# Patient Record
Sex: Male | Born: 1955 | Race: White | Hispanic: No | Smoking: Current every day smoker
Health system: Southern US, Community
[De-identification: ages and names within clinical notes are randomized; demographics above are authoritative.]

## PROBLEM LIST (undated history)

## (undated) DIAGNOSIS — M25519 Pain in unspecified shoulder: Secondary | ICD-10-CM

## (undated) DIAGNOSIS — J449 Chronic obstructive pulmonary disease, unspecified: Secondary | ICD-10-CM

## (undated) DIAGNOSIS — N400 Enlarged prostate without lower urinary tract symptoms: Secondary | ICD-10-CM

## (undated) DIAGNOSIS — M1990 Unspecified osteoarthritis, unspecified site: Secondary | ICD-10-CM

## (undated) HISTORY — DX: Pain in unspecified shoulder: M25.519

## (undated) HISTORY — PX: BACK SURGERY: SHX140

## (undated) HISTORY — DX: Unspecified osteoarthritis, unspecified site: M19.90

---

## 1998-03-07 ENCOUNTER — Emergency Department (HOSPITAL_COMMUNITY): Admission: EM | Admit: 1998-03-07 | Discharge: 1998-03-07 | Payer: Self-pay | Admitting: Emergency Medicine

## 2001-03-02 ENCOUNTER — Inpatient Hospital Stay (HOSPITAL_COMMUNITY): Admission: EM | Admit: 2001-03-02 | Discharge: 2001-03-05 | Payer: Self-pay | Admitting: Emergency Medicine

## 2001-03-02 ENCOUNTER — Encounter: Payer: Self-pay | Admitting: Emergency Medicine

## 2001-03-03 ENCOUNTER — Encounter: Payer: Self-pay | Admitting: Family Medicine

## 2012-07-19 ENCOUNTER — Telehealth: Payer: Self-pay | Admitting: General Practice

## 2012-07-19 ENCOUNTER — Encounter: Payer: Self-pay | Admitting: Family Medicine

## 2012-07-19 ENCOUNTER — Ambulatory Visit: Payer: Self-pay | Admitting: Family Medicine

## 2012-07-19 VITALS — BP 113/78 | HR 110 | Temp 97.6°F | Ht 69.0 in | Wt 154.6 lb

## 2012-07-19 DIAGNOSIS — M25559 Pain in unspecified hip: Secondary | ICD-10-CM

## 2012-07-19 DIAGNOSIS — M549 Dorsalgia, unspecified: Secondary | ICD-10-CM

## 2012-07-19 DIAGNOSIS — M25551 Pain in right hip: Secondary | ICD-10-CM

## 2012-07-19 NOTE — Progress Notes (Signed)
Patient ID: James Mills, male   DOB: 1955/06/28, 57 y.o.   MRN: [id number] SUBJECTIVE: HPI: Has had gradual right hip pain and then it got so bad that he had to go to the [address] clinic at [facility] area. They advised him to follow up with his PCP. He has not been here in years and opted to come and  Re-establish to get worked up and treated. He was empirically treated with pain and anti-inflammatory meds at the Urgent care. Pain to bear weight on the right hip. Pain in the lower back goes to the right hip and the side of the right thigh.  PMH/PSH: reviewed/updated in Epic  SH/FH: reviewed/updated in Epic  Allergies: reviewed/updated in Epic  Medications: reviewed/updated in Epic  Immunizations: reviewed/updated in Epic  ROS: As above in the HPI. All other systems are stable or negative.  OBJECTIVE: APPEARANCE:  Patient in no acute distress.The patient appeared well nourished and normally developed. Acyanotic. Waist: VITAL SIGNS:BP 113/78  Pulse 110  Temp(Src) 97.6 F (36.4 C) (Oral)  Ht 5\' 9"  (1.753 m)  Wt 154 lb 9.6 oz (70.126 kg)  BMI 22.82 kg/m2  WM limping to walk.  SKIN: warm and  Dry without overt rashes, tattoos and scars  HEAD and Neck: without JVD, Head and scalp: normal Eyes:No scleral icterus. Fundi normal, eye movements normal. Ears: Auricle normal, canal normal, Tympanic membranes normal, insufflation normal. Nose: normal Throat: normal Neck & thyroid: normal  CHEST & LUNGS: Chest wall: normal Lungs: Coarse BS. No Rales. There is scatterred Rhonchi  CVS: Reveals the PMI to be normally located. Regular rhythm, First and Second Heart sounds are normal,  absence of murmurs, rubs or gallops. Peripheral vasculature: Radial pulses: normal Dorsal pedis pulses: normal Posterior pulses: normal  ABDOMEN:  Appearance: normal Benign,, no organomegaly, no masses, no Abdominal Aortic enlargement. No Guarding , no rebound. No  Bruits. Bowel sounds: normal  RECTAL: N/A GU: N/A  EXTREMETIES: nonedematous.  MUSCULOSKELETAL:  Spine: tender across the lower back and tender L3to L5 and sacrum.  Joints: right hip has reduced ROM. Pain on internal rotation more than external rotation.  NEUROLOGIC: oriented to time,place and person; nonfocal. Strength is normal Sensory is normal Reflexes are equal but decreased bilaterally. Cranial Nerves are normal.  ASSESSMENT: Hip pain, acute, right - Plan: DG Hip Complete Left  Back pain - Plan: DG Lumbar Spine 2-3 Views   PLAN: Xrays ordered through EPIC to be done at [facility]. Await Xray results.  In meantime use meds Rx at [facility].  RTC prn pending Xrays.  Gracelyn Coventry P. Modesto Charon, M.D.

## 2012-07-19 NOTE — Telephone Encounter (Signed)
appt given for 12:00 with wong

## 2012-07-20 ENCOUNTER — Ambulatory Visit (HOSPITAL_COMMUNITY)
Admission: RE | Admit: 2012-07-20 | Discharge: 2012-07-20 | Disposition: A | Discharge status: Home / Self Care | Payer: No Typology Code available for payment source | Source: Ambulatory Visit | Attending: Family Medicine | Admitting: Family Medicine

## 2012-07-20 ENCOUNTER — Other Ambulatory Visit: Payer: Self-pay | Admitting: Family Medicine

## 2012-07-20 DIAGNOSIS — M25559 Pain in unspecified hip: Secondary | ICD-10-CM | POA: Insufficient documentation

## 2012-07-20 DIAGNOSIS — R52 Pain, unspecified: Secondary | ICD-10-CM

## 2012-07-20 DIAGNOSIS — I700 Atherosclerosis of aorta: Secondary | ICD-10-CM | POA: Insufficient documentation

## 2012-07-20 DIAGNOSIS — M5380 Other specified dorsopathies, site unspecified: Secondary | ICD-10-CM | POA: Insufficient documentation

## 2012-07-20 NOTE — Progress Notes (Signed)
Quick Note:  Call patient. Xray wasn't too bad.  Recommend Physical therapy for his back pain.  ______

## 2012-07-20 NOTE — Progress Notes (Signed)
Quick Note:  Call patient. Xray normal. No change in plan. ______ 

## 2012-07-21 ENCOUNTER — Telehealth: Payer: Self-pay | Admitting: Family Medicine

## 2012-07-21 DIAGNOSIS — M549 Dorsalgia, unspecified: Secondary | ICD-10-CM

## 2012-07-21 NOTE — Telephone Encounter (Signed)
Pt aware of scan results and wants something for pain

## 2012-07-25 ENCOUNTER — Other Ambulatory Visit: Payer: Self-pay | Admitting: Family Medicine

## 2012-07-25 DIAGNOSIS — M25559 Pain in unspecified hip: Secondary | ICD-10-CM

## 2012-07-25 MED ORDER — PREDNISONE 20 MG PO TABS: 40.0000 mg | ORAL_TABLET | Freq: Every day | ORAL | Status: DC

## 2012-07-25 NOTE — Telephone Encounter (Signed)
Spoke with pt and he was informed since reaction to to prednisone cream -do not pick up the rx prednisone dose pak . Advise to take the meloxicam and we would refer to [facility] Pt verbalized understanding.

## 2012-07-25 NOTE — Telephone Encounter (Signed)
Will Rx prednisone tapering dose. Needs to see orthopedics if not resolved. Avoid using meloxicam or other anti-inflammatories while on Prednisone. Ordered inEPIC

## 2012-07-25 NOTE — Telephone Encounter (Signed)
Spoke with pt and stated he had bad reaction "with prednisone cream" .  He stated he was not using cream as orderded --using more than rx and he had "bad thoughts". Pt stated still in a lot pain'   Informed pt I would inform you of this and get your advise.  Pt stated "cannot even get out of bed with so much pain. Can be reached on cell [phone]

## 2012-07-26 ENCOUNTER — Ambulatory Visit (HOSPITAL_COMMUNITY): Payer: Self-pay

## 2012-07-27 ENCOUNTER — Emergency Department (HOSPITAL_COMMUNITY): Payer: No Typology Code available for payment source

## 2012-07-27 ENCOUNTER — Inpatient Hospital Stay (HOSPITAL_COMMUNITY)
Admission: EM | Admit: 2012-07-27 | Discharge: 2012-07-27 | DRG: 071 | Discharge status: Against Medical Advice | Payer: No Typology Code available for payment source | Attending: Internal Medicine | Admitting: Internal Medicine

## 2012-07-27 ENCOUNTER — Encounter (HOSPITAL_COMMUNITY): Payer: Self-pay | Admitting: Emergency Medicine

## 2012-07-27 DIAGNOSIS — G9340 Encephalopathy, unspecified: Principal | ICD-10-CM

## 2012-07-27 DIAGNOSIS — R4182 Altered mental status, unspecified: Secondary | ICD-10-CM

## 2012-07-27 DIAGNOSIS — E871 Hypo-osmolality and hyponatremia: Secondary | ICD-10-CM

## 2012-07-27 DIAGNOSIS — F17200 Nicotine dependence, unspecified, uncomplicated: Secondary | ICD-10-CM | POA: Diagnosis present

## 2012-07-27 DIAGNOSIS — D72829 Elevated white blood cell count, unspecified: Secondary | ICD-10-CM

## 2012-07-27 DIAGNOSIS — F0781 Postconcussional syndrome: Secondary | ICD-10-CM | POA: Diagnosis present

## 2012-07-27 DIAGNOSIS — Z79899 Other long term (current) drug therapy: Secondary | ICD-10-CM

## 2012-07-27 LAB — CBC
HCT: 35.1 % — ABNORMAL LOW (ref 39.0–52.0)
Hemoglobin: 12.6 g/dL — ABNORMAL LOW (ref 13.0–17.0)
RBC: 3.98 MIL/uL — ABNORMAL LOW (ref 4.22–5.81)
RDW: 12.9 % (ref 11.5–15.5)
WBC: 12 10*3/uL — ABNORMAL HIGH (ref 4.0–10.5)

## 2012-07-27 LAB — COMPREHENSIVE METABOLIC PANEL
BUN: 18 mg/dL (ref 6–23)
Calcium: 9.5 mg/dL (ref 8.4–10.5)
Creatinine, Ser: 1.24 mg/dL (ref 0.50–1.35)
GFR calc Af Amer: 75 mL/min — ABNORMAL LOW (ref >90)
Glucose, Bld: 99 mg/dL (ref 70–99)
Total Protein: 7.8 g/dL (ref 6.0–8.3)

## 2012-07-27 LAB — PROTIME-INR: INR: 0.85 (ref 0.00–1.49)

## 2012-07-27 LAB — URINALYSIS, ROUTINE W REFLEX MICROSCOPIC
Bilirubin Urine: NEGATIVE
Ketones, ur: NEGATIVE mg/dL
Nitrite: NEGATIVE
Urobilinogen, UA: 0.2 mg/dL (ref 0.0–1.0)

## 2012-07-27 LAB — POCT I-STAT, CHEM 8
Glucose, Bld: 99 mg/dL (ref 70–99)
HCT: 39 % (ref 39.0–52.0)
Hemoglobin: 13.3 g/dL (ref 13.0–17.0)
Potassium: 3.8 mEq/L (ref 3.5–5.1)
Sodium: 126 mEq/L — ABNORMAL LOW (ref 135–145)

## 2012-07-27 LAB — SAMPLE TO BLOOD BANK

## 2012-07-27 LAB — RAPID URINE DRUG SCREEN, HOSP PERFORMED
Barbiturates: NOT DETECTED
Cocaine: NOT DETECTED

## 2012-07-27 LAB — ETHANOL: Alcohol, Ethyl (B): <11 mg/dL (ref 0–11)

## 2012-07-27 MED ORDER — SODIUM CHLORIDE 0.9 % IV SOLN
INTRAVENOUS | Status: DC
  Administered 2012-07-27: 19:00:00 via INTRAVENOUS

## 2012-07-27 MED ORDER — HYDROMORPHONE HCL PF 1 MG/ML IJ SOLN
0.5000 mg | Freq: Once | INTRAMUSCULAR | Status: DC
  Filled 2012-07-27: qty 1

## 2012-07-27 MED ORDER — DIAZEPAM 5 MG/ML IJ SOLN
5.0000 mg | Freq: Once | INTRAMUSCULAR | Status: DC
  Filled 2012-07-27: qty 2

## 2012-07-27 MED ORDER — LORAZEPAM 2 MG/ML IJ SOLN: 2.0000 mg | INTRAMUSCULAR | Status: DC

## 2012-07-27 MED ORDER — IOHEXOL 300 MG/ML  SOLN
100.0000 mL | Freq: Once | INTRAMUSCULAR | Status: AC | PRN
  Administered 2012-07-27: 100 mL via INTRAVENOUS

## 2012-07-27 MED ORDER — NICOTINE 21 MG/24HR TD PT24
21.0000 mg | MEDICATED_PATCH | Freq: Every day | TRANSDERMAL | Status: DC
  Administered 2012-07-27: 21 mg via TRANSDERMAL
  Filled 2012-07-27: qty 1

## 2012-07-27 MED ORDER — LORAZEPAM 2 MG/ML IJ SOLN
1.0000 mg | Freq: Four times a day (QID) | INTRAMUSCULAR | Status: DC | PRN
  Filled 2012-07-27: qty 1

## 2012-07-27 MED ORDER — KETOROLAC TROMETHAMINE 30 MG/ML IJ SOLN
30.0000 mg | Freq: Once | INTRAMUSCULAR | Status: DC
  Filled 2012-07-27: qty 1

## 2012-07-27 MED ORDER — SODIUM CHLORIDE 0.9 % IV BOLUS (SEPSIS)
1000.0000 mL | Freq: Once | INTRAVENOUS | Status: AC
  Administered 2012-07-27: 1000 mL via INTRAVENOUS

## 2012-07-27 MED ORDER — LORAZEPAM 2 MG/ML IJ SOLN
1.0000 mg | Freq: Once | INTRAMUSCULAR | Status: AC
Start: 2012-07-27 — End: 2012-07-27
  Administered 2012-07-27: 1 mg via INTRAMUSCULAR

## 2012-07-27 MED ORDER — HYDROMORPHONE HCL PF 1 MG/ML IJ SOLN
0.5000 mg | Freq: Once | INTRAMUSCULAR | Status: AC
  Administered 2012-07-27: 0.5 mg via INTRAVENOUS
  Filled 2012-07-27: qty 1
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
## 2012-07-27 NOTE — ED Notes (Signed)
Pt still off the floor at CT. 
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
## 2012-07-27 NOTE — ED Notes (Signed)
Refused vitals signs check.

## 2012-07-27 NOTE — ED Notes (Signed)
Dr. Elisabeth Pigeon notified that pt refused MRI and want to leave. Dr. Elisabeth Pigeon, pt can leave if he wants AMA.

## 2012-07-27 NOTE — ED Notes (Signed)
Pt leaving AMA. Pt informed the risking of leaving AMA.
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
## 2012-07-27 NOTE — ED Notes (Signed)
Patient in room A-7, c-collar and head blocks on, laying on back board. Pt is talking on cell phone.

## 2012-07-29 NOTE — ED Provider Notes (Signed)
Medical screening examination/treatment/procedure(s) were performed by non-physician practitioner and as supervising physician I was immediately available for consultation/collaboration.   Odelle Kosier W. Bach Rocchi, MD 07/29/12 0831 

## 2012-08-04 ENCOUNTER — Encounter (HOSPITAL_COMMUNITY): Payer: Self-pay | Admitting: Emergency Medicine

## 2012-08-04 ENCOUNTER — Emergency Department (HOSPITAL_COMMUNITY): Payer: No Typology Code available for payment source

## 2012-08-04 ENCOUNTER — Other Ambulatory Visit: Payer: Self-pay | Admitting: *Deleted

## 2012-08-04 ENCOUNTER — Emergency Department (HOSPITAL_COMMUNITY)
Admission: EM | Admit: 2012-08-04 | Discharge: 2012-08-04 | Disposition: A | Discharge status: Home / Self Care | Payer: No Typology Code available for payment source | Attending: Emergency Medicine | Admitting: Emergency Medicine

## 2012-08-04 ENCOUNTER — Ambulatory Visit: Payer: Self-pay | Admitting: Family Medicine

## 2012-08-04 VITALS — BP 120/87 | HR 130 | Temp 98.2°F | Wt 147.6 lb

## 2012-08-04 DIAGNOSIS — S161XXD Strain of muscle, fascia and tendon at neck level, subsequent encounter: Secondary | ICD-10-CM

## 2012-08-04 DIAGNOSIS — R102 Pelvic and perineal pain: Secondary | ICD-10-CM

## 2012-08-04 DIAGNOSIS — K920 Hematemesis: Secondary | ICD-10-CM

## 2012-08-04 DIAGNOSIS — Z79899 Other long term (current) drug therapy: Secondary | ICD-10-CM | POA: Insufficient documentation

## 2012-08-04 DIAGNOSIS — K922 Gastrointestinal hemorrhage, unspecified: Secondary | ICD-10-CM | POA: Insufficient documentation

## 2012-08-04 DIAGNOSIS — R109 Unspecified abdominal pain: Secondary | ICD-10-CM

## 2012-08-04 DIAGNOSIS — R000 Tachycardia, unspecified: Secondary | ICD-10-CM | POA: Insufficient documentation

## 2012-08-04 DIAGNOSIS — IMO0002 Reserved for concepts with insufficient information to code with codable children: Secondary | ICD-10-CM | POA: Insufficient documentation

## 2012-08-04 DIAGNOSIS — Y939 Activity, unspecified: Secondary | ICD-10-CM | POA: Insufficient documentation

## 2012-08-04 DIAGNOSIS — S139XXA Sprain of joints and ligaments of unspecified parts of neck, initial encounter: Secondary | ICD-10-CM | POA: Insufficient documentation

## 2012-08-04 DIAGNOSIS — R05 Cough: Secondary | ICD-10-CM | POA: Insufficient documentation

## 2012-08-04 DIAGNOSIS — R0602 Shortness of breath: Secondary | ICD-10-CM | POA: Insufficient documentation

## 2012-08-04 DIAGNOSIS — F17200 Nicotine dependence, unspecified, uncomplicated: Secondary | ICD-10-CM | POA: Insufficient documentation

## 2012-08-04 DIAGNOSIS — R059 Cough, unspecified: Secondary | ICD-10-CM | POA: Insufficient documentation

## 2012-08-04 DIAGNOSIS — Z880 Allergy status to penicillin: Secondary | ICD-10-CM | POA: Insufficient documentation

## 2012-08-04 DIAGNOSIS — S298XXA Other specified injuries of thorax, initial encounter: Secondary | ICD-10-CM | POA: Insufficient documentation

## 2012-08-04 DIAGNOSIS — F0781 Postconcussional syndrome: Secondary | ICD-10-CM | POA: Insufficient documentation

## 2012-08-04 DIAGNOSIS — R0789 Other chest pain: Secondary | ICD-10-CM

## 2012-08-04 DIAGNOSIS — S3981XA Other specified injuries of abdomen, initial encounter: Secondary | ICD-10-CM | POA: Insufficient documentation

## 2012-08-04 DIAGNOSIS — Y92410 Unspecified street and highway as the place of occurrence of the external cause: Secondary | ICD-10-CM | POA: Insufficient documentation

## 2012-08-04 DIAGNOSIS — M549 Dorsalgia, unspecified: Secondary | ICD-10-CM

## 2012-08-04 LAB — CBC WITH DIFFERENTIAL/PLATELET
Basophils Absolute: 0 10*3/uL (ref 0.0–0.1)
Eosinophils Relative: 1 % (ref 0–5)
Lymphocytes Relative: 23 % (ref 12–46)
Neutro Abs: 7.1 10*3/uL (ref 1.7–7.7)
Platelets: 613 10*3/uL — ABNORMAL HIGH (ref 150–400)
RDW: 14 % (ref 11.5–15.5)
WBC: 10.4 10*3/uL (ref 4.0–10.5)

## 2012-08-04 LAB — COMPREHENSIVE METABOLIC PANEL
ALT: 6 U/L (ref 0–53)
AST: 11 U/L (ref 0–37)
CO2: 22 mEq/L (ref 19–32)
Calcium: 9.2 mg/dL (ref 8.4–10.5)
Chloride: 98 mEq/L (ref 96–112)
GFR calc non Af Amer: 77 mL/min — ABNORMAL LOW (ref >90)
Sodium: 134 mEq/L — ABNORMAL LOW (ref 135–145)

## 2012-08-04 LAB — POCT CBC
HCT, POC: 36 % — AB (ref 43.5–53.7)
Hemoglobin: 12.9 g/dL — AB (ref 14.1–18.1)
MCHC: 35.9 g/dL — AB (ref 31.8–35.4)
POC Granulocyte: 8.5 — AB (ref 2–6.9)
RBC: 3.9 M/uL — AB (ref 4.69–6.13)

## 2012-08-04 LAB — URINALYSIS, ROUTINE W REFLEX MICROSCOPIC
Glucose, UA: NEGATIVE mg/dL
Hgb urine dipstick: NEGATIVE
Protein, ur: NEGATIVE mg/dL
pH: 6 (ref 5.0–8.0)

## 2012-08-04 MED ORDER — SODIUM CHLORIDE 0.9 % IV SOLN: INTRAVENOUS | Status: DC

## 2012-08-04 MED ORDER — HYDROMORPHONE HCL PF 1 MG/ML IJ SOLN
INTRAMUSCULAR | Status: AC
  Administered 2012-08-04: 1 mg via INTRAVENOUS
  Filled 2012-08-04: qty 1

## 2012-08-04 MED ORDER — IOHEXOL 300 MG/ML  SOLN
50.0000 mL | Freq: Once | INTRAMUSCULAR | Status: AC | PRN
  Administered 2012-08-04: 50 mL via ORAL

## 2012-08-04 MED ORDER — SODIUM CHLORIDE 0.9 % IV BOLUS (SEPSIS)
2000.0000 mL | Freq: Once | INTRAVENOUS | Status: AC
  Administered 2012-08-04: 2000 mL via INTRAVENOUS

## 2012-08-04 MED ORDER — HYDROCODONE-ACETAMINOPHEN 5-325 MG PO TABS: 2.0000 | ORAL_TABLET | Freq: Three times a day (TID) | ORAL | Status: DC | PRN

## 2012-08-04 MED ORDER — HYDROMORPHONE HCL PF 1 MG/ML IJ SOLN
1.0000 mg | Freq: Once | INTRAMUSCULAR | Status: AC
  Administered 2012-08-04: 1 mg via INTRAVENOUS
  Filled 2012-08-04: qty 1

## 2012-08-04 MED ORDER — HYDROMORPHONE HCL PF 1 MG/ML IJ SOLN: 1.0000 mg | Freq: Once | INTRAMUSCULAR | Status: AC

## 2012-08-04 MED ORDER — IOHEXOL 300 MG/ML  SOLN
100.0000 mL | Freq: Once | INTRAMUSCULAR | Status: AC | PRN
  Administered 2012-08-04: 100 mL via INTRAVENOUS

## 2012-08-04 MED ORDER — OMEPRAZOLE 20 MG PO CPDR: 20.0000 mg | DELAYED_RELEASE_CAPSULE | Freq: Every day | ORAL | Status: AC

## 2012-08-04 MED ORDER — METOCLOPRAMIDE HCL 5 MG/ML IJ SOLN
10.0000 mg | Freq: Once | INTRAMUSCULAR | Status: AC
  Administered 2012-08-04: 10 mg via INTRAVENOUS
  Filled 2012-08-04: qty 2
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
## 2012-08-14 ENCOUNTER — Telehealth: Payer: Self-pay | Admitting: Family Medicine

## 2012-08-14 NOTE — Telephone Encounter (Signed)
appt made

## 2012-08-15 ENCOUNTER — Ambulatory Visit (INDEPENDENT_AMBULATORY_CARE_PROVIDER_SITE_OTHER): Payer: Self-pay | Admitting: Family Medicine

## 2012-08-15 ENCOUNTER — Encounter: Payer: Self-pay | Admitting: Family Medicine

## 2012-08-15 ENCOUNTER — Other Ambulatory Visit: Payer: Self-pay | Admitting: Family Medicine

## 2012-08-15 VITALS — BP 144/93 | HR 94 | Temp 97.3°F | Ht 69.0 in | Wt 147.0 lb

## 2012-08-15 DIAGNOSIS — K625 Hemorrhage of anus and rectum: Secondary | ICD-10-CM

## 2012-08-15 DIAGNOSIS — R5381 Other malaise: Secondary | ICD-10-CM

## 2012-08-15 DIAGNOSIS — R531 Weakness: Secondary | ICD-10-CM

## 2012-08-15 DIAGNOSIS — R5383 Other fatigue: Secondary | ICD-10-CM

## 2012-08-15 DIAGNOSIS — G894 Chronic pain syndrome: Secondary | ICD-10-CM

## 2012-08-15 DIAGNOSIS — F05 Delirium due to known physiological condition: Secondary | ICD-10-CM

## 2012-08-15 DIAGNOSIS — R109 Unspecified abdominal pain: Secondary | ICD-10-CM

## 2012-08-15 LAB — POCT CBC
Granulocyte percent: 62.4 %G (ref 37–80)
HCT, POC: 36 % — AB (ref 43.5–53.7)
Hemoglobin: 12.7 g/dL — AB (ref 14.1–18.1)
Lymph, poc: 2.2 (ref 0.6–3.4)
MCH, POC: 32.6 pg — AB (ref 27–31.2)
MCHC: 35.2 g/dL (ref 31.8–35.4)
MCV: 92.5 fL (ref 80–97)
MPV: 7.2 fL (ref 0–99.8)
POC Granulocyte: 4.6 (ref 2–6.9)
POC LYMPH PERCENT: 29.8 %L (ref 10–50)
Platelet Count, POC: 469 10*3/uL — AB (ref 142–424)
RBC: 3.9 M/uL — AB (ref 4.69–6.13)
RDW, POC: 13.9 %
WBC: 7.3 10*3/uL (ref 4.6–10.2)

## 2012-08-15 MED ORDER — TRAMADOL HCL 50 MG PO TABS: 50.0000 mg | ORAL_TABLET | Freq: Three times a day (TID) | ORAL | Status: DC | PRN

## 2012-08-15 NOTE — Patient Instructions (Signed)
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
## 2012-08-16 LAB — COMPLETE METABOLIC PANEL WITH GFR
ALT: <8 U/L (ref 0–53)
AST: 14 U/L (ref 0–37)
Albumin: 4.3 g/dL (ref 3.5–5.2)
Alkaline Phosphatase: 91 U/L (ref 39–117)
BUN: 6 mg/dL (ref 6–23)
CO2: 23 mEq/L (ref 19–32)
Calcium: 9.4 mg/dL (ref 8.4–10.5)
Chloride: 102 mEq/L (ref 96–112)
Creat: 1 mg/dL (ref 0.50–1.35)
GFR, Est African American: >89 mL/min
GFR, Est Non African American: 85 mL/min
Glucose, Bld: 80 mg/dL (ref 70–99)
Potassium: 4.3 mEq/L (ref 3.5–5.3)
Sodium: 134 mEq/L — ABNORMAL LOW (ref 135–145)
Total Bilirubin: 0.2 mg/dL — ABNORMAL LOW (ref 0.3–1.2)
Total Protein: 7.2 g/dL (ref 6.0–8.3)

## 2012-08-16 LAB — PRESCRIPTION ABUSE MONITORING 17P, URINE
Amphetamine/Meth: NEGATIVE ng/mL
Barbiturate Screen, Urine: NEGATIVE ng/mL
Buprenorphine, Urine: NEGATIVE ng/mL
Cannabinoid Scrn, Ur: NEGATIVE ng/mL
Carisoprodol, Urine: NEGATIVE ng/mL
Cocaine Metabolites: NEGATIVE ng/mL
Creatinine, Urine: 22.96 mg/dL (ref >20.0)
Fentanyl, Ur: NEGATIVE ng/mL
MDMA URINE: NEGATIVE ng/mL
Meperidine, Ur: NEGATIVE ng/mL
Methadone Screen, Urine: NEGATIVE ng/mL
Opiate Screen, Urine: NEGATIVE ng/mL
Oxycodone Screen, Ur: NEGATIVE ng/mL
Propoxyphene: NEGATIVE ng/mL
Tapentadol, urine: NEGATIVE ng/mL
Tramadol Scrn, Ur: NEGATIVE ng/mL
Zolpidem, Urine: NEGATIVE ng/mL

## 2012-08-16 LAB — HEPATIC FUNCTION PANEL
ALT: <8 U/L (ref 0–53)
AST: 14 U/L (ref 0–37)
Albumin: 4.3 g/dL (ref 3.5–5.2)
Alkaline Phosphatase: 91 U/L (ref 39–117)
Bilirubin, Direct: 0.1 mg/dL (ref 0.0–0.3)
Indirect Bilirubin: 0.1 mg/dL (ref 0.0–0.9)
Total Bilirubin: 0.2 mg/dL — ABNORMAL LOW (ref 0.3–1.2)
Total Protein: 7.2 g/dL (ref 6.0–8.3)

## 2012-08-16 LAB — ETHANOL: Alcohol, Ethyl (B): <10 mg/dL (ref 0–10)

## 2012-08-16 LAB — LIPASE: Lipase: 37 U/L (ref 0–75)

## 2012-08-16 LAB — TSH: TSH: 1.166 u[IU]/mL (ref 0.350–4.500)

## 2012-08-16 LAB — VITAMIN B12: Vitamin B-12: 331 pg/mL (ref 211–911)

## 2012-08-18 ENCOUNTER — Encounter: Payer: Self-pay | Admitting: Gastroenterology

## 2012-08-18 LAB — BENZODIAZEPINES (GC/LC/MS), URINE
Alprazolam (GC/LC/MS), ur confirm: NEGATIVE ng/mL
Alprazolam metabolite (GC/LC/MS), ur confirm: NEGATIVE ng/mL
Clonazepam metabolite (GC/LC/MS), ur confirm: NEGATIVE ng/mL
Diazepam (GC/LC/MS), ur confirm: NEGATIVE ng/mL
Estazolam (GC/LC/MS), ur confirm: NEGATIVE ng/mL
Flunitrazepam metabolite (GC/LC/MS), ur confirm: NEGATIVE ng/mL
Flurazepam metabolite (GC/LC/MS), ur confirm: NEGATIVE ng/mL
Lorazepam (GC/LC/MS), ur confirm: NEGATIVE ng/mL
Midazolam (GC/LC/MS), ur confirm: NEGATIVE ng/mL
Nordiazepam (GC/LC/MS), ur confirm: 51 ng/mL
Oxazepam (GC/LC/MS), ur confirm: 579 ng/mL
Temazepam (GC/LC/MS), ur confirm: 76 ng/mL
Triazolam metabolite (GC/LC/MS), ur confirm: NEGATIVE ng/mL

## 2012-08-29 ENCOUNTER — Encounter: Payer: Self-pay | Admitting: Family Medicine

## 2012-08-29 ENCOUNTER — Ambulatory Visit (INDEPENDENT_AMBULATORY_CARE_PROVIDER_SITE_OTHER): Payer: Self-pay | Admitting: Family Medicine

## 2012-08-29 VITALS — BP 117/76 | HR 86 | Temp 97.1°F | Wt 147.0 lb

## 2012-08-29 DIAGNOSIS — R5381 Other malaise: Secondary | ICD-10-CM

## 2012-08-29 DIAGNOSIS — F05 Delirium due to known physiological condition: Secondary | ICD-10-CM

## 2012-08-29 DIAGNOSIS — F329 Major depressive disorder, single episode, unspecified: Secondary | ICD-10-CM

## 2012-08-29 DIAGNOSIS — M549 Dorsalgia, unspecified: Secondary | ICD-10-CM

## 2012-08-29 DIAGNOSIS — R531 Weakness: Secondary | ICD-10-CM
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
## 2012-08-29 NOTE — Patient Instructions (Addendum)

## 2012-09-04 ENCOUNTER — Ambulatory Visit: Payer: Self-pay | Admitting: Gastroenterology

## 2012-09-12 ENCOUNTER — Ambulatory Visit (INDEPENDENT_AMBULATORY_CARE_PROVIDER_SITE_OTHER): Payer: Self-pay | Admitting: Family Medicine

## 2012-09-12 ENCOUNTER — Encounter: Payer: Self-pay | Admitting: Family Medicine

## 2012-09-12 VITALS — BP 120/71 | HR 81 | Temp 98.7°F | Wt 147.6 lb

## 2012-09-12 DIAGNOSIS — M549 Dorsalgia, unspecified: Secondary | ICD-10-CM
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
## 2012-09-19 ENCOUNTER — Other Ambulatory Visit: Payer: Self-pay | Admitting: Family Medicine

## 2012-09-19 ENCOUNTER — Other Ambulatory Visit: Payer: Self-pay | Admitting: *Deleted

## 2012-09-19 MED ORDER — TRAMADOL HCL 50 MG PO TABS: 50.0000 mg | ORAL_TABLET | Freq: Three times a day (TID) | ORAL | Status: DC | PRN

## 2012-09-19 NOTE — Telephone Encounter (Signed)
LAST RF 08/15/12. PRINT AND CALL PT IF APPROVED. LAST OV 09/12/12.

## 2012-09-19 NOTE — Telephone Encounter (Signed)
Tramadol refilled.

## 2012-09-19 NOTE — Telephone Encounter (Signed)
Called into pharmacy

## 2012-09-28 ENCOUNTER — Ambulatory Visit (INDEPENDENT_AMBULATORY_CARE_PROVIDER_SITE_OTHER): Payer: Self-pay | Admitting: Family Medicine

## 2012-09-28 ENCOUNTER — Encounter: Payer: Self-pay | Admitting: Family Medicine

## 2012-09-28 VITALS — BP 128/80 | HR 83 | Temp 98.1°F | Ht 69.0 in | Wt 140.0 lb

## 2012-09-28 DIAGNOSIS — F329 Major depressive disorder, single episode, unspecified: Secondary | ICD-10-CM

## 2012-09-28 DIAGNOSIS — R109 Unspecified abdominal pain: Secondary | ICD-10-CM

## 2012-09-28 DIAGNOSIS — F411 Generalized anxiety disorder: Secondary | ICD-10-CM

## 2012-09-28 MED ORDER — TRAMADOL HCL 50 MG PO TABS: 50.0000 mg | ORAL_TABLET | Freq: Three times a day (TID) | ORAL | Status: AC | PRN

## 2012-09-28 MED ORDER — SERTRALINE HCL 50 MG PO TABS: 50.0000 mg | ORAL_TABLET | Freq: Every day | ORAL | Status: AC

## 2012-09-28 MED ORDER — HYDROXYZINE HCL 25 MG PO TABS: ORAL_TABLET | ORAL | Status: AC

## 2012-09-28 NOTE — Patient Instructions (Signed)
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
## 2013-01-01 ENCOUNTER — Ambulatory Visit (INDEPENDENT_AMBULATORY_CARE_PROVIDER_SITE_OTHER): Payer: Self-pay | Admitting: Family Medicine

## 2013-01-01 DIAGNOSIS — R251 Tremor, unspecified: Secondary | ICD-10-CM

## 2013-01-01 DIAGNOSIS — F329 Major depressive disorder, single episode, unspecified: Secondary | ICD-10-CM

## 2013-01-01 DIAGNOSIS — R259 Unspecified abnormal involuntary movements: Secondary | ICD-10-CM

## 2013-01-01 DIAGNOSIS — R2689 Other abnormalities of gait and mobility: Secondary | ICD-10-CM

## 2013-01-01 DIAGNOSIS — R29818 Other symptoms and signs involving the nervous system: Secondary | ICD-10-CM

## 2013-01-01 NOTE — Progress Notes (Signed)
  Subjective:    Patient ID: James Mills, male    DOB: 04/19/1955, 57 y.o.   MRN: [id number]  HPI This 57 y.o. male presents for evaluation of having a headache and staggering. He states he was at work and he was told by his "boss man" that the people on the Job were concerned he may be drinking on the job because he was staggering. 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  He was Told by his work to go see a Librarian, academic.  He has tremors.  He states he loses his balance And he has difficulty and 2 months he fell.  He denies dizziness.  He has hx of arthritis of hips and  Knees,He states.  He has problems with anxiety and depression.  He states he couldn't tolerate the Sertraline.  He has been having difficulties with depression.  He states he only took the sertraline For a week then quit for no particular reason.  He does deny using ETOH   Review of Systems He c/o tremors, balance problems, and depression. No chest pain, SOB, HA, dizziness, vision change, N/V, diarrhea, constipation, dysuria, urinary urgency or frequency, myalgias, arthralgias or rash.     Objective:   Physical Exam  Vital signs noted  Well developed well nourished depressed male.  HEENT - Head atraumatic Normocephalic                Eyes - PERRLA, Conjuctiva - clear Sclera- Clear EOMI                Ears - EAC's Wnl TM's Wnl Gross Hearing WNL                Nose - Nares patent                 Throat - oropharanx wnl Respiratory - Lungs CTA bilateral Cardiac - RRR S1 and S2 without murmur GI - Abdomen soft Nontender and bowel sounds active x 4 Extremities - No edema. Neuro - Rhomberg negative, fine tremors in hands, gait normal, CN II - XII intact. Difficulty standing with heel to shin and loses balance bilateral.        Assessment & Plan:  Tremors of nervous system - Plan: Ambulatory referral to Neurology  Balance problem - Plan: Ambulatory referral to Neurology.  Depression  - Sertraline 50 mg one po qd.  Discussed resuming this and follow Up prn and after neurology referral.  Follow up after Neurology referral  Deatra Canter FNP

## 2013-01-01 NOTE — Patient Instructions (Signed)

## 2013-01-15 ENCOUNTER — Encounter (INDEPENDENT_AMBULATORY_CARE_PROVIDER_SITE_OTHER): Payer: Self-pay

## 2013-01-15 ENCOUNTER — Ambulatory Visit (INDEPENDENT_AMBULATORY_CARE_PROVIDER_SITE_OTHER): Payer: Self-pay | Admitting: Neurology

## 2013-01-15 ENCOUNTER — Encounter: Payer: Self-pay | Admitting: Neurology

## 2013-01-15 VITALS — BP 142/91 | HR 116 | Temp 97.2°F | Ht 68.0 in | Wt 137.0 lb

## 2013-01-15 DIAGNOSIS — R51 Headache: Secondary | ICD-10-CM

## 2013-01-15 DIAGNOSIS — R29818 Other symptoms and signs involving the nervous system: Secondary | ICD-10-CM

## 2013-01-15 DIAGNOSIS — R259 Unspecified abnormal involuntary movements: Secondary | ICD-10-CM

## 2013-01-15 DIAGNOSIS — G4700 Insomnia, unspecified: Secondary | ICD-10-CM

## 2013-01-15 DIAGNOSIS — R251 Tremor, unspecified: Secondary | ICD-10-CM

## 2013-01-15 DIAGNOSIS — R2689 Other abnormalities of gait and mobility: Secondary | ICD-10-CM

## 2013-01-15 NOTE — Patient Instructions (Addendum)
Remember to drink plenty of fluid, eat healthy meals and do not skip any meals. Try to eat protein with a every meal and eat a healthy snack such as fruit or nuts in between meals. Try to keep a regular sleep-wake schedule and try to exercise daily, particularly in the form of walking, 20-30 minutes a day, if you can. Change positions slowly and you should start using a cane when you are off balance.   As far as your medications are concerned, I would like to suggest no new medications. Please stop alcohol altogether, please stop smoking. We will consider physical therapy.   As far as diagnostic testing: MRI brain.  Please remember, that any kind of tremor may be exacerbated by anxiety, anger, nervousness, excitement, dehydration, sleep deprivation, by caffeine, and low blood sugar values or blood sugar fluctuations. Some medications, especially some antidepressants and lithium can cause or exacerbate tremors. Tremors may temporarily calm down her subside with the use of a benzodiazepine such as Valium or related medications and with alcohol. Be aware however that drinking alcohol is not an approved treatment or appropriate treatment for tremor control and long-term use of benzodiazepines such as Valium, lorazepam, alprazolam, or clonazepam can cause habit formation, physical and psychological addiction.  Please remember, common headache triggers are: sleep deprivation, dehydration, overheating, stress, hypoglycemia or skipping meals and blood sugar fluctuations, excessive pain medications or excessive alcohol use or caffeine withdrawal. Some people have food triggers such as aged cheese, orange juice or chocolate, especially dark chocolate, or MSG (monosodium glutamate). Try to avoid these headache triggers as much possible. It may be helpful to keep a headache diary to figure out what makes your headaches worse or brings them on and what alleviates them. Some people report headache onset after exercise but  studies have shown that regular exercise may actually prevent headaches from coming. If you have exercise-induced headaches, please make sure that you drink plenty of fluid before and after exercising and that you do not over do it and do not overheat.  Please remember to try to maintain good sleep hygiene, which means: Keep a regular sleep and wake schedule, try not to exercise or have a meal within 2 hours of your bedtime, try to keep your bedroom conducive for sleep, that is, cool and dark, without light distractors such as an illuminated alarm clock, and refrain from watching TV right before sleep or in the middle of the night and do not keep the TV or radio on during the night. Also, try not to use or play on electronic devices at bedtime, such as your cell phone, tablet PC or laptop. If you like to read at bedtime on an electronic device, try to dim the background light as much as possible. Do not eat in the middle of the night.
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
## 2013-02-07 ENCOUNTER — Telehealth: Payer: Self-pay | Admitting: Family Medicine
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
## 2013-02-07 NOTE — Telephone Encounter (Signed)
Please followup on the phone call

## 2013-02-19 ENCOUNTER — Telehealth: Payer: Self-pay | Admitting: Neurology

## 2013-02-19 NOTE — Telephone Encounter (Signed)
[place] with [facility] would like a call back from [place] regarding this patient. She said he could also speak with Juanell Fairly.

## 2013-02-19 NOTE — Telephone Encounter (Signed)
I returned call to James Mills. I left VM that Brett Canales is not here the rest of this week. Should they be requesting any medical records or information, please have patient/guardian provide a written release requesting what documents you need. My number is: [phone]

## 2013-07-13 ENCOUNTER — Emergency Department (HOSPITAL_COMMUNITY): Payer: No Typology Code available for payment source

## 2013-07-13 ENCOUNTER — Emergency Department (HOSPITAL_COMMUNITY)
Admission: EM | Admit: 2013-07-13 | Discharge: 2013-07-13 | Discharge status: Against Medical Advice | Payer: No Typology Code available for payment source | Attending: Emergency Medicine | Admitting: Emergency Medicine

## 2013-07-13 ENCOUNTER — Encounter (HOSPITAL_COMMUNITY): Payer: Self-pay | Admitting: Emergency Medicine

## 2013-07-13 DIAGNOSIS — Z87891 Personal history of nicotine dependence: Secondary | ICD-10-CM

## 2013-07-13 DIAGNOSIS — M129 Arthropathy, unspecified: Secondary | ICD-10-CM | POA: Insufficient documentation

## 2013-07-13 DIAGNOSIS — Z880 Allergy status to penicillin: Secondary | ICD-10-CM | POA: Insufficient documentation

## 2013-07-13 DIAGNOSIS — F17200 Nicotine dependence, unspecified, uncomplicated: Secondary | ICD-10-CM | POA: Insufficient documentation

## 2013-07-13 DIAGNOSIS — R0902 Hypoxemia: Secondary | ICD-10-CM | POA: Insufficient documentation

## 2013-07-13 DIAGNOSIS — Z87448 Personal history of other diseases of urinary system: Secondary | ICD-10-CM | POA: Insufficient documentation

## 2013-07-13 DIAGNOSIS — J209 Acute bronchitis, unspecified: Secondary | ICD-10-CM | POA: Insufficient documentation

## 2013-07-13 HISTORY — DX: Benign prostatic hyperplasia without lower urinary tract symptoms: N40.0

## 2013-07-13 LAB — CBC WITH DIFFERENTIAL/PLATELET
BASOS ABS: 0.1 10*3/uL (ref 0.0–0.1)
BASOS PCT: 2 % — AB (ref 0–1)
Eosinophils Absolute: 0.1 10*3/uL (ref 0.0–0.7)
Eosinophils Relative: 2 % (ref 0–5)
HCT: 43.5 % (ref 39.0–52.0)
Hemoglobin: 14.7 g/dL (ref 13.0–17.0)
Lymphocytes Relative: 26 % (ref 12–46)
Lymphs Abs: 2.1 10*3/uL (ref 0.7–4.0)
MCH: 32 pg (ref 26.0–34.0)
MCHC: 33.8 g/dL (ref 30.0–36.0)
MCV: 94.6 fL (ref 78.0–100.0)
Monocytes Absolute: 0.8 10*3/uL (ref 0.1–1.0)
Monocytes Relative: 10 % (ref 3–12)
NEUTROS ABS: 4.7 10*3/uL (ref 1.7–7.7)
NEUTROS PCT: 60 % (ref 43–77)
PLATELETS: 305 10*3/uL (ref 150–400)
RBC: 4.6 MIL/uL (ref 4.22–5.81)
RDW: 15.5 % (ref 11.5–15.5)
WBC: 7.8 10*3/uL (ref 4.0–10.5)

## 2013-07-13 LAB — COMPREHENSIVE METABOLIC PANEL
ALBUMIN: 4 g/dL (ref 3.5–5.2)
ALK PHOS: 142 U/L — AB (ref 39–117)
ALT: 69 U/L — ABNORMAL HIGH (ref 0–53)
AST: 180 U/L — AB (ref 0–37)
BILIRUBIN TOTAL: 0.3 mg/dL (ref 0.3–1.2)
BUN: 7 mg/dL (ref 6–23)
CHLORIDE: 93 meq/L — AB (ref 96–112)
CO2: 26 mEq/L (ref 19–32)
CREATININE: 0.86 mg/dL (ref 0.50–1.35)
Calcium: 8.9 mg/dL (ref 8.4–10.5)
GFR calc Af Amer: >90 mL/min (ref >90)
GFR calc non Af Amer: >90 mL/min (ref >90)
Glucose, Bld: 83 mg/dL (ref 70–99)
POTASSIUM: 4.1 meq/L (ref 3.7–5.3)
Sodium: 138 mEq/L (ref 137–147)
Total Protein: 8.4 g/dL — ABNORMAL HIGH (ref 6.0–8.3)

## 2013-07-13 LAB — I-STAT TROPONIN, ED: TROPONIN I, POC: 0 ng/mL (ref 0.00–0.08)

## 2013-07-13 MED ORDER — LEVOFLOXACIN 500 MG PO TABS: 500.0000 mg | ORAL_TABLET | Freq: Every day | ORAL | Status: AC

## 2013-07-13 MED ORDER — LEVOFLOXACIN IN D5W 500 MG/100ML IV SOLN
500.0000 mg | Freq: Once | INTRAVENOUS | Status: AC
  Administered 2013-07-13: 500 mg via INTRAVENOUS
  Filled 2013-07-13: qty 100

## 2013-07-13 MED ORDER — PREDNISONE 50 MG PO TABS: 50.0000 mg | ORAL_TABLET | Freq: Every day | ORAL | Status: DC

## 2013-07-13 MED ORDER — ALBUTEROL (5 MG/ML) CONTINUOUS INHALATION SOLN
15.0000 mg/h | INHALATION_SOLUTION | Freq: Once | RESPIRATORY_TRACT | Status: AC
Start: 2013-07-13 — End: 2013-07-13
  Administered 2013-07-13: 15 mg/h via RESPIRATORY_TRACT
  Filled 2013-07-13: qty 20

## 2013-07-13 MED ORDER — IPRATROPIUM BROMIDE 0.02 % IN SOLN
0.5000 mg | Freq: Once | RESPIRATORY_TRACT | Status: AC
  Administered 2013-07-13: 0.5 mg via RESPIRATORY_TRACT
  Filled 2013-07-13: qty 2.5

## 2013-07-13 MED ORDER — THIAMINE HCL 100 MG/ML IJ SOLN
Freq: Once | INTRAVENOUS | Status: AC
  Administered 2013-07-13: 14:00:00 via INTRAVENOUS
  Filled 2013-07-13: qty 1000

## 2013-07-13 MED ORDER — ALBUTEROL SULFATE HFA 108 (90 BASE) MCG/ACT IN AERS
2.0000 | INHALATION_SPRAY | RESPIRATORY_TRACT | Status: DC | PRN
  Filled 2013-07-13: qty 6.7

## 2013-07-13 MED ORDER — ALBUTEROL SULFATE HFA 108 (90 BASE) MCG/ACT IN AERS: 2.0000 | INHALATION_SPRAY | RESPIRATORY_TRACT | Status: AC | PRN

## 2013-07-13 NOTE — ED Notes (Signed)
X-ray at bedside

## 2013-07-13 NOTE — ED Notes (Addendum)
Pt removed both IV sites. Pt refused repeat vitals, pt insist on leaving and has dressed himself. Pt made aware that his leaving is endangering his health and he is at risk for stopping breathing or worse. Pt states he understands" but needs to go see his Mama and needs to go home and get in his bed. Pt is A&O, SOB upon ambulation. Pt given PRN inhaler because he states that he cannot get his rx's today.

## 2013-07-13 NOTE — ED Notes (Signed)
Pt refusing tx, removed monitor leads and O2. Pt states that he "needs to go home." Dr Wilkie Aye tried to talk pt into staying but is refusing. Pt trying to get out of bed. Explained to pt that he needs to stay and be treated as he is very sick. Pt is 84%n on RA. Pt has agreed to stay and receive IV abx but wants to leave as soon as they are done. Pt will not leave oxygen on and keeps removing. Pt is A&O. Will monitor

## 2013-07-13 NOTE — ED Notes (Signed)
Pt reports that he has had cough with SOB since January. Pt reports drinking alcohol in excess today. Pt is A&O and in NAD.

## 2013-07-13 NOTE — ED Notes (Addendum)
Pt from home via EMS c/o SOB and cough since January. Per EMS- pt reports non-productive cough since January. Pt reports that he has" been drinking a lot today." Pt was assisted en route with breathing due to ETOH intoxication. Pt now A&O and in NAD. Pt is tachycardic at 140's. Pt received 125 solumedrol and ned with 10 albuterol and 1 Atrovent en route.

## 2013-07-13 NOTE — ED Provider Notes (Signed)
CSN: [id number]     Arrival date & time 07/13/13  1320 History   First MD Initiated Contact with Patient 07/13/13 1329     Chief Complaint  Patient presents with  . Shortness of Breath  . Cough     (Consider location/radiation/quality/duration/timing/severity/associated sxs/prior Treatment) HPI  This is a 58 year old male with an extensive smoking history who presents with shortness of breath. Patient reports shortness of breath since January. He reports associated productive cough. No noted fevers. Shortness of breath became acutely worse today. Brought in by EMS. Noted to be tight and wheezing. Was given a duo neb and Solu-Medrol in route. Patient is speaking in full sentences; however, noted to be 88% on room air and was placed on supplemental oxygen. Patient also endorses "drinking a lot" this morning. He denies daily drinking but states that he drink a lot today. Denies any chest pain.  Past Medical History  Diagnosis Date  . Shoulder pain   . Arthritis   . BPH (benign prostatic hyperplasia)    Past Surgical History  Procedure Laterality Date  . Back surgery     No family history on file. History  Substance Use Topics  . Smoking status: Current Every Day Smoker -- 1.00 packs/day    Types: Cigarettes    Start date: 07/19/1977  . Smokeless tobacco: Never Used  . Alcohol Use: 14.4 oz/week    24 Cans of beer per week     Comment: binge drinker up to 24 beers per day    Review of Systems  Constitutional: Negative.  Negative for fever.  Respiratory: Positive for cough and shortness of breath. Negative for chest tightness.   Cardiovascular: Negative.  Negative for chest pain.  Gastrointestinal: Negative.  Negative for abdominal pain.  Genitourinary: Negative.  Negative for dysuria.  Neurological: Negative for headaches.  All other systems reviewed and are negative.     Allergies  Azithromycin; Darvocet; Doxycycline; and Penicillins  Home Medications   Prior to  Admission medications   Medication Sig Start Date End Date Taking? Authorizing Provider  hydrOXYzine (ATARAX/VISTARIL) 25 MG tablet One po tid prn anxiety prn 09/28/12   Deatra Canter, FNP  ibuprofen (ADVIL,MOTRIN) 200 MG tablet Take 200 mg by mouth every 6 (six) hours as needed for pain.    Historical Provider, MD  MELATONIN PO Take 1 capsule by mouth at bedtime as needed.    Historical Provider, MD  omeprazole (PRILOSEC) 20 MG capsule Take 1 capsule (20 mg total) by mouth daily. 08/04/12   [name], MD  sertraline (ZOLOFT) 50 MG tablet Take 1 tablet (50 mg total) by mouth daily. 09/28/12   Deatra Canter, FNP  traMADol (ULTRAM) 50 MG tablet Take 1 tablet (50 mg total) by mouth every 8 (eight) hours as needed for pain. 09/28/12   Deatra Canter, FNP   BP 130/93  Pulse 143  Temp(Src) 97.6 F (36.4 C)  Resp 24  SpO2 86% Physical Exam  Nursing note and vitals reviewed. Constitutional: He is oriented to person, place, and time.  Disheveled, no acute distress  HENT:  Head: Normocephalic and atraumatic.  MM dry  Cardiovascular: Normal rate, regular rhythm and normal heart sounds.   No murmur heard. Pulmonary/Chest: Effort normal. No respiratory distress. He has wheezes.  Poor air movement, tight  Abdominal: Soft. Bowel sounds are normal. There is no tenderness. There is no rebound.  Musculoskeletal: He exhibits no edema.  Lymphadenopathy:    He has no cervical adenopathy.  Neurological: He is alert and oriented to person, place, and time.  Skin: Skin is warm and dry.  Psychiatric: He has a normal mood and affect.    ED Course  Procedures (including critical care time) Labs Review Labs Reviewed  CBC WITH DIFFERENTIAL - Abnormal; Notable for the following:    Basophils Relative 2 (*)    All other components within normal limits  COMPREHENSIVE METABOLIC PANEL - Abnormal; Notable for the following:    Chloride 93 (*)    Total Protein 8.4 (*)    AST 180 (*)    ALT 69 (*)     Alkaline Phosphatase 142 (*)    All other components within normal limits  I-STAT TROPOININ, ED    Imaging Review Dg Chest Portable 1 View  07/13/2013   CLINICAL DATA:  Cough, shortness of breath, smoker  EXAM: PORTABLE CHEST - 1 VIEW  COMPARISON:  08/04/2012  FINDINGS: Cardiomediastinal silhouette is stable. No pulmonary edema. There is slight worsening reticular interstitial prominence especially lower lobes bilaterally. Mild pneumonitis cannot be excluded. No segmental infiltrate.  IMPRESSION: Worsening reticular interstitial prominence bilateral especially lower lobes. Mild pneumonitis cannot be excluded. No segmental infiltrate. No convincing pulmonary edema.   Electronically Signed   By: Natasha MeadLiviu  Pop M.D.   On: 07/13/2013 14:05     EKG Interpretation   Date/Time:  Friday Jul 13 2013 13:23:57 EDT Ventricular Rate:  141 PR Interval:    QRS Duration: 95 QT Interval:  309 QTC Calculation: 473 R Axis:   -104 Text Interpretation:  Normal sinus rhythm Ventricular premature complex  Left anterior fascicular block Minimal ST elevation, lateral leads No  prior Confirmed by HORTON  MD, Toni AmendOURTNEY ([id number]) on 07/13/2013 2:35:25 PM      MDM   Final diagnoses:  Bronchospasm with bronchitis, acute  Hypoxia  Smoking history    Patient presents with shortness of breath. Also noted to be intoxicated. Extensive smoking history. Patient noted to be hypoxic. Patient was given Solu-Medrol in route. Will place on continuous neb. Chest x-ray without evidence of pneumonia. Patient given Levaquin given history and possible COPD.  On recheck, patient continues to have wheezing. Have advised admission given hypoxia. Patient is refusing admission. He is currently awake, alert, and oriented. While he endorses drinking and does appear intoxicated, he appears to have capacity and can tell me the risk and benefits of being discharged including death. Patient will leave against medical device. He was given for  prescription for prednisone and Levaquin.    Shon Batonourtney F Horton, MD 07/13/13 [phone]

## 2013-08-16 ENCOUNTER — Emergency Department (HOSPITAL_BASED_OUTPATIENT_CLINIC_OR_DEPARTMENT_OTHER): Payer: Self-pay

## 2013-08-16 ENCOUNTER — Emergency Department (HOSPITAL_BASED_OUTPATIENT_CLINIC_OR_DEPARTMENT_OTHER)
Admission: EM | Admit: 2013-08-16 | Discharge: 2013-08-17 | Disposition: A | Discharge status: Home / Self Care | Payer: Self-pay | Attending: Emergency Medicine | Admitting: Emergency Medicine

## 2013-08-16 ENCOUNTER — Encounter (HOSPITAL_BASED_OUTPATIENT_CLINIC_OR_DEPARTMENT_OTHER): Payer: Self-pay | Admitting: Emergency Medicine

## 2013-08-16 DIAGNOSIS — Y9289 Other specified places as the place of occurrence of the external cause: Secondary | ICD-10-CM | POA: Insufficient documentation

## 2013-08-16 DIAGNOSIS — Z87448 Personal history of other diseases of urinary system: Secondary | ICD-10-CM | POA: Insufficient documentation

## 2013-08-16 DIAGNOSIS — IMO0002 Reserved for concepts with insufficient information to code with codable children: Secondary | ICD-10-CM | POA: Insufficient documentation

## 2013-08-16 DIAGNOSIS — Y9389 Activity, other specified: Secondary | ICD-10-CM | POA: Insufficient documentation

## 2013-08-16 DIAGNOSIS — E871 Hypo-osmolality and hyponatremia: Secondary | ICD-10-CM | POA: Insufficient documentation

## 2013-08-16 DIAGNOSIS — R569 Unspecified convulsions: Secondary | ICD-10-CM | POA: Insufficient documentation

## 2013-08-16 DIAGNOSIS — S3981XA Other specified injuries of abdomen, initial encounter: Secondary | ICD-10-CM | POA: Insufficient documentation

## 2013-08-16 DIAGNOSIS — S298XXA Other specified injuries of thorax, initial encounter: Secondary | ICD-10-CM | POA: Insufficient documentation

## 2013-08-16 DIAGNOSIS — F1010 Alcohol abuse, uncomplicated: Secondary | ICD-10-CM | POA: Insufficient documentation

## 2013-08-16 DIAGNOSIS — M129 Arthropathy, unspecified: Secondary | ICD-10-CM | POA: Insufficient documentation

## 2013-08-16 DIAGNOSIS — R1012 Left upper quadrant pain: Secondary | ICD-10-CM | POA: Insufficient documentation

## 2013-08-16 DIAGNOSIS — R296 Repeated falls: Secondary | ICD-10-CM | POA: Insufficient documentation

## 2013-08-16 DIAGNOSIS — Z79899 Other long term (current) drug therapy: Secondary | ICD-10-CM | POA: Insufficient documentation

## 2013-08-16 DIAGNOSIS — F17200 Nicotine dependence, unspecified, uncomplicated: Secondary | ICD-10-CM | POA: Insufficient documentation

## 2013-08-16 DIAGNOSIS — Z792 Long term (current) use of antibiotics: Secondary | ICD-10-CM | POA: Insufficient documentation

## 2013-08-16 LAB — URINALYSIS, ROUTINE W REFLEX MICROSCOPIC
Bilirubin Urine: NEGATIVE
Glucose, UA: NEGATIVE mg/dL
Hgb urine dipstick: NEGATIVE
Ketones, ur: NEGATIVE mg/dL
Leukocytes, UA: NEGATIVE
Nitrite: NEGATIVE
Protein, ur: NEGATIVE mg/dL
Specific Gravity, Urine: 1.009 (ref 1.005–1.030)
Urobilinogen, UA: 0.2 mg/dL (ref 0.0–1.0)
pH: 6.5 (ref 5.0–8.0)

## 2013-08-16 LAB — CBC WITH DIFFERENTIAL/PLATELET
Basophils Absolute: 0 10*3/uL (ref 0.0–0.1)
Basophils Relative: 0 % (ref 0–1)
EOS ABS: 0.1 10*3/uL (ref 0.0–0.7)
EOS PCT: 1 % (ref 0–5)
HCT: 39.4 % (ref 39.0–52.0)
Hemoglobin: 13.7 g/dL (ref 13.0–17.0)
Lymphocytes Relative: 6 % — ABNORMAL LOW (ref 12–46)
Lymphs Abs: 0.6 10*3/uL — ABNORMAL LOW (ref 0.7–4.0)
MCH: 32.6 pg (ref 26.0–34.0)
MCHC: 34.8 g/dL (ref 30.0–36.0)
MCV: 93.8 fL (ref 78.0–100.0)
MONOS PCT: 15 % — AB (ref 3–12)
Monocytes Absolute: 1.5 10*3/uL — ABNORMAL HIGH (ref 0.1–1.0)
Neutro Abs: 7.8 10*3/uL — ABNORMAL HIGH (ref 1.7–7.7)
Neutrophils Relative %: 78 % — ABNORMAL HIGH (ref 43–77)
Platelets: 179 10*3/uL (ref 150–400)
RBC: 4.2 MIL/uL — ABNORMAL LOW (ref 4.22–5.81)
RDW: 13.9 % (ref 11.5–15.5)
WBC: 10 10*3/uL (ref 4.0–10.5)

## 2013-08-16 LAB — COMPREHENSIVE METABOLIC PANEL
ALT: 46 U/L (ref 0–53)
AST: 75 U/L — ABNORMAL HIGH (ref 0–37)
Albumin: 3.9 g/dL (ref 3.5–5.2)
Alkaline Phosphatase: 117 U/L (ref 39–117)
BUN: 5 mg/dL — ABNORMAL LOW (ref 6–23)
CO2: 22 mEq/L (ref 19–32)
Calcium: 9.2 mg/dL (ref 8.4–10.5)
Chloride: 86 mEq/L — ABNORMAL LOW (ref 96–112)
Creatinine, Ser: 0.9 mg/dL (ref 0.50–1.35)
GFR calc Af Amer: >90 mL/min (ref >90)
GFR calc non Af Amer: >90 mL/min (ref >90)
Glucose, Bld: 145 mg/dL — ABNORMAL HIGH (ref 70–99)
Potassium: 3.1 mEq/L — ABNORMAL LOW (ref 3.7–5.3)
Sodium: 126 mEq/L — ABNORMAL LOW (ref 137–147)
Total Bilirubin: 0.7 mg/dL (ref 0.3–1.2)
Total Protein: 7.6 g/dL (ref 6.0–8.3)

## 2013-08-16 LAB — RAPID URINE DRUG SCREEN, HOSP PERFORMED
Amphetamines: NOT DETECTED
BENZODIAZEPINES: POSITIVE — AB
Barbiturates: NOT DETECTED
Cocaine: NOT DETECTED
Opiates: NOT DETECTED
Tetrahydrocannabinol: NOT DETECTED

## 2013-08-16 LAB — ETHANOL: Alcohol, Ethyl (B): <11 mg/dL (ref 0–11)

## 2013-08-16 MED ORDER — SODIUM CHLORIDE 0.9 % IV BOLUS (SEPSIS)
1000.0000 mL | Freq: Once | INTRAVENOUS | Status: AC
  Administered 2013-08-16: 1000 mL via INTRAVENOUS

## 2013-08-16 MED ORDER — LORAZEPAM 2 MG/ML IJ SOLN
INTRAMUSCULAR | Status: AC
  Filled 2013-08-16: qty 1

## 2013-08-16 MED ORDER — IOHEXOL 300 MG/ML  SOLN
100.0000 mL | Freq: Once | INTRAMUSCULAR | Status: AC | PRN
  Administered 2013-08-16: 100 mL via INTRAVENOUS

## 2013-08-16 MED ORDER — LORAZEPAM 2 MG/ML IJ SOLN
1.0000 mg | Freq: Once | INTRAMUSCULAR | Status: AC
  Administered 2013-08-16: 1 mg via INTRAVENOUS
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
## 2013-08-16 NOTE — ED Provider Notes (Signed)
This chart was scribed for Layla MawKristen N Ward, DO by Ardelia Memsylan Malpass, ED Scribe. This patient was seen in room MH05/MH05 and the patient's care was started at 9:17 PM.  TIME SEEN: 9:17 PM  CHIEF COMPLAINT: Seizure  HPI:  HPI Comments: James Mills is a 58 y.o. male brought by EMS to the Emergency Department complaining of a seizure that occurred PTA. Pt's seizure was witnessed by his mother. Pt lives with his mother who reported to EMS that pt has a history of EtOH withdrawal seizures. Pt was reported to have fallen after the seizure, landing on carpeted floor. Pt is complaining of left-sided chest wall pain and upper abdominal pain after the fall. He denies any drug use. Pt reports that he has not used any drugs or alcohol today. He reports that he last had a binge-drinking episode several days ago. Pt reports that he is not on anti-seizure medications. Denies a history of epilepsy. He denies any SI, HI or hallucinations. States he is not interested in detox. No recent fevers, head injury, anticoagulation use, vomiting or diarrhea. No numbness or focal weakness. Glucose normal with EMS.   ROS: See HPI Constitutional: no fever  Eyes: no drainage  ENT: no runny nose   Cardiovascular:  no chest pain  Resp: no SOB  GI: no vomiting GU: no dysuria Integumentary: no rash  Allergy: no hives  Musculoskeletal: no leg swelling Neurological: no slurred speech, +seizure ROS otherwise negative  PAST MEDICAL HISTORY/PAST SURGICAL HISTORY:  Past Medical History  Diagnosis Date  . Shoulder pain   . Arthritis   . BPH (benign prostatic hyperplasia)     MEDICATIONS:  Prior to Admission medications   Medication Sig Start Date End Date Taking? Authorizing Provider  albuterol (PROVENTIL HFA;VENTOLIN HFA) 108 (90 BASE) MCG/ACT inhaler Inhale 2 puffs into the lungs every 4 (four) hours as needed for wheezing or shortness of breath. 07/13/13   Shon Batonourtney F Horton, MD  hydrOXYzine (ATARAX/VISTARIL) 25 MG  tablet One po tid prn anxiety prn 09/28/12   Deatra CanterWilliam J Oxford, FNP  ibuprofen (ADVIL,MOTRIN) 200 MG tablet Take 200 mg by mouth every 6 (six) hours as needed for pain.    Historical Provider, MD  levofloxacin (LEVAQUIN) 500 MG tablet Take 1 tablet (500 mg total) by mouth daily. 07/13/13   Shon Batonourtney F Horton, MD  MELATONIN PO Take 1 capsule by mouth at bedtime as needed.    Historical Provider, MD  omeprazole (PRILOSEC) 20 MG capsule Take 1 capsule (20 mg total) by mouth daily. 08/04/12   Hurman HornJohn M Bednar, MD  predniSONE (DELTASONE) 50 MG tablet Take 1 tablet (50 mg total) by mouth daily. 07/13/13   Shon Batonourtney F Horton, MD  sertraline (ZOLOFT) 50 MG tablet Take 1 tablet (50 mg total) by mouth daily. 09/28/12   Deatra CanterWilliam J Oxford, FNP  traMADol (ULTRAM) 50 MG tablet Take 1 tablet (50 mg total) by mouth every 8 (eight) hours as needed for pain. 09/28/12   Deatra CanterWilliam J Oxford, FNP    ALLERGIES:  Allergies  Allergen Reactions  . 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  Leodis Liverpool. Darvocet [Propoxyphene N-Acetaminophen]   . Doxycycline     Patient is not aware of this allergy  . Penicillins     SOCIAL HISTORY:  History  Substance Use Topics  . Smoking status: Current Every Day Smoker -- 1.00 packs/day    Types: Cigarettes    Start date: 07/19/1977  . Smokeless tobacco: Never Used  . Alcohol  Use: 14.4 oz/week    24 Cans of beer per week     Comment: binge drinker up to 24 beers per day    FAMILY HISTORY: History reviewed. No pertinent family history.  EXAM: BP 128/74  Pulse 96  Temp(Src) 98.5 F (36.9 C) (Oral)  Resp 18  Ht 5\' 9"  (1.753 m)  Wt 145 lb (65.772 kg)  BMI 21.40 kg/m2  SpO2 95% CONSTITUTIONAL: Alert and oriented x3 and responds appropriately to questions. Well-appearing; well-nourished, in no apparent distress HEAD: Normocephalic EYES: Conjunctivae clear, PERRL ENT: normal nose; no rhinorrhea; moist mucous membranes; pharynx without lesions noted but no midline spinal tenderness or step-off  or deformity NECK: Supple, no meningismus, no LAD  CARD: RRR; S1 and S2 appreciated; no murmurs, no clicks, no rubs, no gallops RESP: Normal chest excursion without splinting or tachypnea; breath sounds clear and equal bilaterally; no wheezes, no rhonchi, no rales, patient is tender to palpation over his left chest wall with no deformity, crepitus, ecchymosis ABD/GI: Normal bowel sounds; non-distended; soft, Tender in the LUQ with voluntary guarding, no rebound BACK:  No midline spinal tenderness, step-offs or deformity; The back appears normal and is non-tender to palpation, there is no CVA tenderness EXT: Normal ROM in all joints; non-tender to palpation; no edema; normal capillary refill; no cyanosis MSK: Tenderness to palpation over left rib without crepitus, step-off or deformity SKIN: Normal color for age and race; warm NEURO: Moves all extremities equally; normal sensation in all extremities; CN 2-12 intact; Oriented x3 PSYCH: The patient's mood and manner are appropriate. Grooming and personal hygiene are appropriate. Denies SI, HI or hallucinations.  MEDICAL DECISION MAKING: Patient here with likely alcohol withdrawal seizure. He states he does drink alcohol yesterday. He has been binge drinking recently because his mother has esophageal cancer he states "I am the only person there take care of her". Denies any SI or HI. CT is not interested in detox. Will obtain labs, urine. He is tender to palpation over his left chest wall and left upper quadrant it had a fall from standing. Will obtain CT imaging to evaluate for a possible injury. We'll give Ativan to prevent a seizure here in the emergency department.  ED PROGRESS: Patient's labs are remarkable for a sodium of 126, chloride 86, potassium slightly low at 3.1.. Have discussed with patient that his hyponatremia is likely due to dehydration from his binge drinking. Have given patient IV fluids in the emergency department. Patient has had  hyponatremia lower than this in the past without seizures. CT scans showed no acute injury. Have recommended admission to the hospital for IV hydration but he refuses. Patient is oriented x3, does not appear intoxicated, is able to verbalize risks of leaving the hospital back to me. I discussed with patient that hyponatremia may lead to another seizure, confusion. Have advised him to stop drinking but he states "I'm not ready". The patient is competent to make these decisions himself. I do not feel he has to sign out AGAINST MEDICAL ADVICE at this time but have strongly encouraged him to stay which he refuses. Discussed strict return precautions. Patient verbalizes understanding.    I personally performed the services described in this documentation, which was scribed in my presence. The recorded information has been reviewed and is accurate.   Layla MawKristen N Ward, DO 08/17/13 [id number]

## 2013-08-16 NOTE — Discharge Instructions (Signed)
Alcohol and Nutrition Nutrition serves two purposes. It provides energy. It also maintains body structure and function. Food supplies energy. It also provides the building blocks needed to replace worn or damaged cells. Alcoholics often eat poorly. This limits their supply of essential nutrients. This affects energy supply and structure maintenance. Alcohol also affects the body's nutrients in:  Digestion.  Storage.  Using and getting rid of waste products. IMPAIRMENT OF NUTRIENT DIGESTION AND UTILIZATION   Once ingested, food must be broken down into small components (digested). Then it is available for energy. It helps maintain body structure and function. Digestion begins in the mouth. It continues in the stomach and intestines, with help from the pancreas. The nutrients from digested food are absorbed from the intestines into the blood. Then they are carried to the liver. The liver prepares nutrients for:  Immediate use.  Storage and future use.  Alcohol inhibits the breakdown of nutrients into usable molecules.  It decreases secretion of digestive enzymes from the pancreas.  Alcohol impairs nutrient absorption by damaging the cells lining the stomach and intestines.  It also interferes with moving some nutrients into the blood.  In addition, nutritional deficiencies themselves may lead to further absorption problems.  For example, folate deficiency changes the cells that line the small intestine. This impairs how water is absorbed. It also affects absorbed nutrients. These include glucose, sodium, and additional folate.  Even if nutrients are digested and absorbed, alcohol can prevent them from being fully used. It changes their transport, storage, and excretion. Impaired utilization of nutrients by alcoholics is indicated by:  Decreased liver stores of vitamins, such as vitamin A.  Increased excretion of nutrients such as fat. ALCOHOL AND ENERGY SUPPLY   Three basic  nutritional components found in food are:  Carbohydrates.  Proteins.  Fats.  These are used as energy. Some alcoholics take in as much as 50% of their total daily calories from alcohol. They often neglect important foods.  Even when enough food is eaten, alcohol can impair the ways the body controls blood sugar (glucose) levels. It may either increase or decrease blood sugar.  In non-diabetic alcoholics, increased blood sugar (hyperglycemia) is caused by poor insulin secretion. It is usually temporary.  Decreased blood sugar (hypoglycemia) can cause serious injury even if this condition is short-lived. Low blood sugar can happen when a fasting or malnourished person drinks alcohol. When there is no food to supply energy, stored sugar is used up. The products of alcohol inhibit forming glucose from other compounds such as amino acids. As a result, alcohol causes the brain and other body tissue to lack glucose. It is needed for energy and function.  Alcohol is an energy source. But how the body processes and uses the energy from alcohol is complex. Also, when alcohol is substituted for carbohydrates, subjects tend to lose weight. This indicates that they get less energy from alcohol than from food. ALCOHOL - MAINTAINING CELL STRUCTURE AND FUNCTION  Structure Cells are made mostly of protein. So an adequate protein diet is important for maintaining cell structure. This is especially true if cells are being damaged. Research indicates that alcohol affects protein nutrition by causing impaired:  Digestion of proteins to amino acids.  Processing of amino acids by the small intestine and liver.  Synthesis of proteins from amino acids.  Protein secretion by the liver. Function Nutrients are essential for the body to function well. They provide the tools that the body needs to work well:  Proteins. °· Vitamins. °· Minerals. °Alcohol can disrupt body function. It may cause nutrient  deficiencies. And it may interfere with the way nutrients are processed. °Vitamins °· Vitamins are essential to maintain growth and normal metabolism. They regulate many of the body`s processes. Chronic heavy drinking causes deficiencies in many vitamins. This is caused by eating less. And, in some cases, vitamins may be poorly absorbed. For example, alcohol inhibits fat absorption. It impairs how the vitamins A, E, and D are normally absorbed along with dietary fats. Not enough vitamin A may cause night blindness. Not enough vitamin D may cause softening of the bones. °· Some alcoholics lack vitamins A, C, D, E, K, and the B vitamins. These are all involved in wound healing and cell maintenance. In particular, because vitamin K is necessary for blood clotting, lacking that vitamin can cause delayed clotting. The result is excess bleeding. Lacking other vitamins involved in brain function may cause severe neurological damage. °Minerals °Deficiencies of minerals such as calcium, magnesium, iron, and zinc are common in alcoholics. The alcohol itself does not seem to affect how these minerals are absorbed. Rather, they seem to occur secondary to other alcohol-related problems, such as: °· Less calcium absorbed. °· Not enough magnesium. °· More urinary excretion. °· Vomiting. °· Diarrhea. °· Not enough iron due to gastrointestinal bleeding. °· Not enough zinc or losses related to other nutrient deficiencies. °· Mineral deficiencies can cause a variety of medical consequences. These range from calcium-related bone disease to zinc-related night blindness and skin lesions. °ALCOHOL, MALNUTRITION, AND MEDICAL COMPLICATIONS  °Liver Disease  °· Alcoholic liver damage is caused primarily by alcohol itself. But poor nutrition may increase the risk of alcohol-related liver damage. For example, nutrients normally found in the liver are known to be affected by drinking alcohol. These include carotenoids, which are the major  sources of vitamin A, and vitamin E compounds. Decreases in such nutrients may play some role in alcohol-related liver damage. °Pancreatitis °· Research suggests that malnutrition may increase the risk of developing alcoholic pancreatitis. Research suggests that a diet lacking in protein may increase alcohol's damaging effect on the pancreas. °Brain °· Nutritional deficiencies may have severe effects on brain function. These may be permanent. Specifically, thiamine deficiencies are often seen in alcoholics. They can cause severe neurological problems. These include: °¨ Impaired movement. °¨ Memory loss seen in Wernicke-Korsakoff syndrome. °Pregnancy °· Alcohol has toxic effects on fetal development. It causes alcohol-related birth defects. They include fetal alcohol syndrome. Alcohol itself is toxic to the fetus. Also, the nutritional deficiency can affect how the fetus develops. That may compound the risk of developmental damage. °· Nutritional needs during pregnancy are 10% to 30% greater than normal. Food intake can increase by as much as 140% to cover the needs of both mother and fetus. An alcoholic mother`s nutritional problems may adversely affect the nutrition of the fetus. And alcohol itself can also restrict nutrition flow to the fetus. °NUTRITIONAL STATUS OF ALCOHOLICS  °Techniques for assessing nutritional status include: °· Taking body measurements to estimate fat reserves. They include: °¨ Weight. °¨ Height. °¨ Mass. °¨ Skin fold thickness. °· Performing blood analysis to provide measurements of circulating: °¨ Proteins. °¨ Vitamins. °¨ Minerals. °· These techniques tend to be imprecise. For many nutrients, there is no clear "cut-off" point that would allow an accurate definition of deficiency. So assessing the nutritional status of alcoholics is limited by these techniques. Dietary status may provide information about the risk of developing nutritional problems.   Dietary status is assessed by:  Taking  patients' dietary histories.  Evaluating the amount and types of food they are eating.  It is difficult to determine what exact amount of alcohol begins to have damaging effects on nutrition. In general, moderate drinkers have 2 drinks or less per day. They seem to be at little risk for nutritional problems. Various medical disorders begin to appear at greater levels.  Research indicates that the majority of even the heaviest drinkers have few obvious nutritional deficiencies. Many alcoholics who are hospitalized for medical complications of their disease do have severe malnutrition. Alcoholics tend to eat poorly. Often they eat less than the amounts of food necessary to provide enough:  Carbohydrates.  Protein.  Fat.  Vitamins A and C.  B vitamins.  Minerals like calcium and iron. Of major concern is alcohol's effect on digesting food and use of nutrients. It may shift a mildly malnourished person toward severe malnutrition. Document Released: 12/03/2004 Document Revised: 05/03/2011 Document Reviewed: 05/19/2005 [facility] Patient Information 2015 [place], [place]. This information is not intended to replace advice given to you by your health care provider. Make sure you discuss any questions you have with your health care provider.  Alcohol Withdrawal Anytime drug use is interfering with normal living activities it has become abuse. This includes problems with family and friends. Psychological dependence has developed when your mind tells you that the drug is needed. This is usually followed by physical dependence when a continuing increase of drugs are required to get the same feeling or "high." This is known as addiction or chemical dependency. A person's risk is much higher if there is a history of chemical dependency in the family. Mild Withdrawal Following Stopping Alcohol, When Addiction or Chemical Dependency Has Developed When a person has developed tolerance to alcohol, any sudden  stopping of alcohol can cause uncomfortable physical symptoms. Most of the time these are mild and consist of tremors in the hands and increases in heart rate, breathing, and temperature. Sometimes these symptoms are associated with anxiety, panic attacks, and bad dreams. There may also be stomach upset. Normal sleep patterns are often interrupted with periods of inability to sleep (insomnia). This may last for 6 months. Because of this discomfort, many people choose to continue drinking to get rid of this discomfort and to try to feel normal. Severe Withdrawal with Decreased or No Alcohol Intake, When Addiction or Chemical Dependency Has Developed About five percent of alcoholics will develop signs of severe withdrawal when they stop using alcohol. One sign of this is development of generalized seizures (convulsions). Other signs of this are severe agitation and confusion. This may be associated with believing in things which are not real or seeing things which are not really there (delusions and hallucinations). Vitamin deficiencies are usually present if alcohol intake has been long-term. Treatment for this most often requires hospitalization and close observation. Addiction can only be helped by stopping use of all chemicals. This is hard but may save your life. With continual alcohol use, possible outcomes are usually loss of self respect and esteem, violence, and death. Addiction cannot be cured but it can be stopped. This often requires outside help and the care of professionals. Treatment centers are listed in the yellow pages under Cocaine, Narcotics, and Alcoholics Anonymous. Most hospitals and clinics can refer you to a specialized care center. It is not necessary for you to go through the uncomfortable symptoms of withdrawal. Your caregiver can provide you with medicines that will  help you through this difficult period. Try to avoid situations, friends, or drugs that made it possible for you to keep  using alcohol in the past. Learn how to say no. It takes a long period of time to overcome addictions to all drugs, including alcohol. There may be many times when you feel as though you want a drink. After getting rid of the physical addiction and withdrawal, you will have a lessening of the craving which tells you that you need alcohol to feel normal. Call your caregiver if more support is needed. Learn who to talk to in your family and among your friends so that during these periods you can receive outside help. Alcoholics Anonymous (AA) has helped many people over the years. To get further help, contact AA or call your caregiver, counselor, or clergyperson. Al-Anon and Alateen are support groups for friends and family members of an alcoholic. The people who love and care for an alcoholic often need help, too. For information about these organizations, check your phone directory or call a local alcoholism treatment center.  SEEK IMMEDIATE MEDICAL CARE IF:   You have a seizure.  You have a fever.  You experience uncontrolled vomiting or you vomit up blood. This may be bright red or look like black coffee grounds.  You have blood in the stool. This may be bright red or appear as a black, tarry, bad-smelling stool.  You become lightheaded or faint. Do not drive if you feel this way. Have someone else drive you or call [phone] for help.  You become more agitated or confused.  You develop uncontrolled anxiety.  You begin to see things that are not really there (hallucinate). Your caregiver has determined that you completely understand your medical condition, and that your mental state is back to normal. You understand that you have been treated for alcohol withdrawal, have agreed not to drink any alcohol for a minimum of 1 day, will not operate a car or other machinery for 24 hours, and have had an opportunity to ask any questions about your condition. Document Released: 11/18/2004 Document Revised:  05/03/2011 Document Reviewed: 09/27/2007 [facility] Patient Information 2015 [place], [place]. This information is not intended to replace advice given to you by your health care provider. Make sure you discuss any questions you have with your health care provider.  Hyponatremia  Hyponatremia is when the amount of salt (sodium) in your blood is too low. When sodium levels are low, your cells will absorb extra water and swell. The swelling happens throughout the body, but it mostly affects the brain. Severe brain swelling (cerebral edema), seizures, or coma can happen.  CAUSES   Heart, kidney, or liver problems.  Thyroid problems.  Adrenal gland problems.  Severe vomiting and diarrhea.  Certain medicines or illegal drugs.  Dehydration.  Drinking too much water.  Low-sodium diet. SYMPTOMS   Nausea and vomiting.  Confusion.  Lethargy.  Agitation.  Headache.  Twitching or shaking (seizures).  Unconsciousness.  Appetite loss.  Muscle weakness and cramping. DIAGNOSIS  Hyponatremia is identified by a simple blood test. Your caregiver will perform a history and physical exam to try to find the cause and type of hyponatremia. Other tests may be needed to measure the amount of sodium in your blood and urine. TREATMENT  Treatment will depend on the cause.   Fluids may be given through the vein (IV).  Medicines may be used to correct the sodium imbalance. If medicines are causing the problem, they will  need to be adjusted.  Water or fluid intake may be restricted to restore proper balance. The speed of correcting the sodium problem is very important. If the problem is corrected too fast, nerve damage (sometimes unchangeable) can happen. HOME CARE INSTRUCTIONS   Only take medicines as directed by your caregiver. Many medicines can make hyponatremia worse. Discuss all your medicines with your caregiver.  Carefully follow any recommended diet, including any fluid  restrictions.  You may be asked to repeat lab tests. Follow these directions.  Avoid alcohol and recreational drugs. SEEK MEDICAL CARE IF:   You develop worsening nausea, fatigue, headache, confusion, or weakness.  Your original hyponatremia symptoms return.  You have problems following the recommended diet. SEEK IMMEDIATE MEDICAL CARE IF:   You have a seizure.  You faint.  You have ongoing diarrhea or vomiting. MAKE SURE YOU:   Understand these instructions.  Will watch your condition.  Will get help right away if you are not doing well or get worse. Document Released: 01/29/2002 Document Revised: 05/03/2011 Document Reviewed: 07/26/2010 [facility] Patient Information 2015 [place]. This information is not intended to replace advice given to you by your health care provider. Make sure you discuss any questions you have with your health care provider. Dehydration, Adult Dehydration is when you lose more fluids from the body than you take in. Vital organs like the kidneys, brain, and heart cannot function without a proper amount of fluids and salt. Any loss of fluids from the body can cause dehydration.  CAUSES   Vomiting.  Diarrhea.  Excessive sweating.  Excessive urine output.  Fever. SYMPTOMS  Mild dehydration  Thirst.  Dry lips.  Slightly dry mouth. Moderate dehydration  Very dry mouth.  Sunken eyes.  Skin does not bounce back quickly when lightly pinched and released.  Dark urine and decreased urine production.  Decreased tear production.  Headache. Severe dehydration  Very dry mouth.  Extreme thirst.  Rapid, weak pulse (more than 100 beats per minute at rest).  Cold hands and feet.  Not able to sweat in spite of heat and temperature.  Rapid breathing.  Blue lips.  Confusion and lethargy.  Difficulty being awakened.  Minimal urine production.  No tears. DIAGNOSIS  Your caregiver will diagnose dehydration based on your  symptoms and your exam. Blood and urine tests will help confirm the diagnosis. The diagnostic evaluation should also identify the cause of dehydration. TREATMENT  Treatment of mild or moderate dehydration can often be done at home by increasing the amount of fluids that you drink. It is best to drink small amounts of fluid more often. Drinking too much at one time can make vomiting worse. Refer to the home care instructions below. Severe dehydration needs to be treated at the hospital where you will probably be given intravenous (IV) fluids that contain water and electrolytes. HOME CARE INSTRUCTIONS   Ask your caregiver about specific rehydration instructions.  Drink enough fluids to keep your urine clear or pale yellow.  Drink small amounts frequently if you have nausea and vomiting.  Eat as you normally do.  Avoid:  Foods or drinks high in sugar.  Carbonated drinks.  Juice.  Extremely hot or cold fluids.  Drinks with caffeine.  Fatty, greasy foods.  Alcohol.  Tobacco.  Overeating.  Gelatin desserts.  Wash your hands well to avoid spreading bacteria and viruses.  Only take over-the-counter or prescription medicines for pain, discomfort, or fever as directed by your caregiver.  Ask your caregiver if  you should continue all prescribed and over-the-counter medicines.  Keep all follow-up appointments with your caregiver. SEEK MEDICAL CARE IF:  You have abdominal pain and it increases or stays in one area (localizes).  You have a rash, stiff neck, or severe headache.  You are irritable, sleepy, or difficult to awaken.  You are weak, dizzy, or extremely thirsty. SEEK IMMEDIATE MEDICAL CARE IF:   You are unable to keep fluids down or you get worse despite treatment.  You have frequent episodes of vomiting or diarrhea.  You have blood or green matter (bile) in your vomit.  You have blood in your stool or your stool looks black and tarry.  You have not urinated  in 6 to 8 hours, or you have only urinated a small amount of very dark urine.  You have a fever.  You faint. MAKE SURE YOU:   Understand these instructions.  Will watch your condition.  Will get help right away if you are not doing well or get worse. Document Released: 02/08/2005 Document Revised: 05/03/2011 Document Reviewed: 09/28/2010 [facility] Patient Information 2015 [place], [place]. This information is not intended to replace advice given to you by your health care provider. Make sure you discuss any questions you have with your health care provider.    Emergency Department Resource Guide 1) Find a Doctor and Pay Out of Pocket Although you won't have to find out who is covered by your insurance plan, it is a good idea to ask around and get recommendations. You will then need to call the office and see if the doctor you have chosen will accept you as a new patient and what types of options they offer for patients who are self-pay. Some doctors offer discounts or will set up payment plans for their patients who do not have insurance, but you will need to ask so you aren't surprised when you get to your appointment.  2) Contact Your Local Health Department Not all health departments have doctors that can see patients for sick visits, but many do, so it is worth a call to see if yours does. If you don't know where your local health department is, you can check in your phone book. The CDC also has a tool to help you locate your state's health department, and many state websites also have listings of all of their local health departments.  3) Find a Walk-in Clinic If your illness is not likely to be very severe or complicated, you may want to try a walk in clinic. These are popping up all over the country in pharmacies, drugstores, and shopping centers. They're usually staffed by nurse practitioners or physician assistants that have been trained to treat common illnesses and complaints.  They're usually fairly quick and inexpensive. However, if you have serious medical issues or chronic medical problems, these are probably not your best option.  No Primary Care Doctor: - Call [facility] at  [phone] - they can help you locate a primary care doctor that  accepts your insurance, provides certain services, etc. - Physician Referral Service- [phone]  Chronic Pain Problems: Organization         Address  Phone   Notes  [facility]  [phone] Patients need to be referred by their primary care doctor.   Medication Assistance: Organization         Address  Phone   Notes  [facility] Medication [facility] [address] [place], [place] [address] [phone] --Must be a resident of [facility] -- Must have NO insurance coverage whatsoever (no Medicaid/ Medicare, etc.) -- The pt. MUST have a primary care doctor that directs their care regularly and follows them in the community   [facility]  [phone]   [employer]  [phone]    Agencies that provide inexpensive medical care: Organization         Address  Phone   Notes  [facility]  [phone]   [facility]    [phone]   [facility] [address] [place], [place] [address] [phone]   [facility] [address], [place] [phone]   [facility]    [phone]   [facility]    [phone]   [facility]  [address], [place] Phone:  [phone], Fax:  [phone] Hours of Operation:  9 am - 6 pm, M-F.  Also accepts Medicaid/Medicare and self-pay.  [facility]  [address], [place] Phone: [phone], Fax: [phone]. Hours of Operation:  8:30 am - 5:30 pm, M-F.  Also accepts Medicaid and self-pay.  [facility] [address] [place]  Phone: [phone]   [facility] [address] Natasha Bence [place], [place] [phone], Ext. [phone] Mondays & Thursdays: 7-9 AM.  First 15 patients are seen on a first come, first serve basis.    Medicaid-accepting [facility] Providers:  Organization         Address  Phone   Notes  [facility] [address], [place] [phone] Also accepts self-pay patients.  [facility] [address] Laurell Josephs [place], [place]  [phone]   [facility] [address], [place] [phone]   [facility] [address] [place] [phone]   Renaye Rakers [address], [place]   [phone] Only accepts [place] patients after they have their name applied to their card.   Self-Pay (no insurance) in [facility]:  Organization         Address  Phone   Notes  Sickle Cell Patients, [facility] [address] [place], [place] [phone]   [facility] [address] [place], [place] [phone]   [facility] [place]  [address], [place] [phone]   [facility]/Dr. Osei-Bonsu  [address] [place] or [address], [place] [phone] Phone number for both [place] and [place] locations is the same.  Urgent Medical and [facility] [address], [place] [phone]   [facility] [address] [place] or [address] [phone]   [facility] [address], [place] [phone], phone; [phone], fax Sees patients 1st and 3rd Saturday of every month.  Must not qualify for public or private insurance (i.e. Medicaid, Medicare, [place] Health Choice, Veterans' Benefits)  Household income should be no more than 200% of the poverty level The clinic cannot  treat you if you are pregnant or think you are pregnant  Sexually transmitted diseases are not treated at the clinic.    Dental Care: Organization  Address  Phone  Notes  [facility] [address], [place] [phone] Accepts children up to age 421 who are enrolled in [place] or [place] Health Choice; pregnant women with a Medicaid card; and children who have applied for Medicaid or [place] Health Choice, but were declined, whose parents can pay a reduced fee at time of service.  [facility] [place]  [address], [place] [phone] Accepts children up to age 56 who are enrolled in [place] or [place] Health Choice; pregnant women with a Medicaid card; and children who have applied for Medicaid or [place] Health Choice, but were declined, whose parents can pay a reduced fee at time of service.  [facility]  [address], [place] [phone] Patients are seen by appointment only. Walk-ins are not accepted. [facility] will see patients 56 years of age and older. Monday - Tuesday (8am-5pm) Most Wednesdays (8:30-5pm) $30 per visit, cash only  [facility]  [address], [facility] [phone] Patients are seen by appointment only. Walk-ins are not accepted. [facility] will see patients 56 years of age and older. One Wednesday Evening (Monthly: Volunteer Based).  $30 per visit, cash only  [facility]  [phone] for adults; Children under age 564, call Graduate Pediatric Dentistry at [phone]. Children aged 364-14, please call [phone] to request a pediatric application.  Dental services are provided in all areas of dental care including fillings, crowns and bridges, complete and partial dentures, implants, gum treatment, root canals, and extractions. Preventive care is also provided.  Treatment is provided to both adults and children. Patients are selected via a lottery and there is often a waiting list.   [facility] [address], [place]  [phone] [email or web address]   [facility] [address], [place], [place] [phone], Ext. [phone] Second and Fourth Thursday of each month, opens at 6:30 AM; Clinic ends at 9 AM.  Patients are seen on a first-come first-served basis, and a limited number are seen during each clinic.   [facility]  [address], [place], [place] [phone]) [phone]   Eligibility Requirements You must have lived in [place], [place], or [place] counties for at least the last three months.   You cannot be eligible for state or federal sponsored [place]healthcare insurance, including CIGNAVeterans Administration, [place]icaid, or [employer] EntertainmentMedicare.   You generally cannot be eligible for healthcare insurance through your employer.    How to apply: Eligibility screenings are held every Tuesday and Wednesday afternoon from 1:00 pm until 4:00 pm. You do not need an appointment for the interview!  [facility] [address], [place], [place] [phone]   [facility]  [phone]   [facility]  [phone]   [facility]  [phone]    Behavioral Health Resources in the Community: Intensive Outpatient Programs Organization         Address  Phone  Notes  [facility] [address], [place], [place] [phone]   [facility] [address], [place], [place] [phone]   [facility] [address], [place], [place]  [phone]   [facility] [address],  [place], [place] [phone] or [phone]   Substance Abuse Resources Organization  Address  Phone  Notes  Al[facility] and Drug Services  [phone]   [place]   [phone]   [facility] [place]  [phone]   Chinita Pester  [phone]   Residential & Outpatient Substance Abuse Program  [phone]   Psychological Services Organization         Address  Phone  Notes  [facility] [place]  [place]  [phone]   [place] [address] [place] or [phone]    Mobile Crisis Teams Organization         Address  Phone  Notes  [facility], Mobile Crisis Care Unit  [phone]   [facility]  [address] [place], [place]   [place] [address] [place] [phone]    Self-Help/Support Groups Organization         Address  Phone             Notes  [place] [place] - variety of support groups  [place] Call for more information  Narcotics Anonymous (NA), [facility] [address], [employer] [place]  2 meetings at this location   Special educational needs teacher         Address  Phone  Notes  [facility] [place],    [place]  [phone]   [facility]  [address], [place] [phone], [place], [place]   [place], [place] [phone] Admissions: 8am-3pm M-F  Incentives Substance [place] [address],    [place], [place] [phone]   [facility] [address] [place], [place], [place]   [facility] [address]  [place], [place]   [facility] - Intensive Outpatient [address], [place], [place]   [facility] ([address]  [place], [place] [phone] or [phone]   [facility] ([facility]) [address] [place], [place] Accepts Medicaid  [facility] [place] [address]  [place] [phone] Substance  Abuse/Addiction Treatment   [facility] Organization         Address  Phone  Notes  [facility]  [phone]   Domenic Schwab, PhD [address] [place], [place]   [phone] or [phone]   [place], [place] [phone]   [facility] [address] [place], [place] [phone] Insurance/Medicaid/sponsorship through [facility] [place] and Families [address] [place]                                    [place], [place] [phone] [place] [address], [place] [phone]    Dr. Adele Schilder  [phone]   [facility] 1) [address], [place] 2) [place] 3)  [place] [address], [place] [phone]  [phone]   [place] [phone] or [phone] (After Hours)

## 2013-08-17 NOTE — ED Notes (Signed)
Cab called per pt request pt refused admission

## 2014-05-03 ENCOUNTER — Telehealth: Payer: Self-pay | Admitting: *Deleted
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
## 2014-05-06 ENCOUNTER — Telehealth: Payer: Self-pay | Admitting: *Deleted

## 2014-05-06 ENCOUNTER — Telehealth: Payer: Self-pay | Admitting: Family Medicine

## 2014-05-06 NOTE — Telephone Encounter (Signed)
Dr Truett PernaSherrill recommends he go see Dr. Christell ConstantMoore first and get a referral for GI evaluation   Pt given this information

## 2014-05-09 NOTE — Telephone Encounter (Addendum)
Patient states that he needs [facility]. Patient states that he is having trouble swallowing and that his mother had esophageal cancer.

## 2014-05-14 ENCOUNTER — Encounter (HOSPITAL_BASED_OUTPATIENT_CLINIC_OR_DEPARTMENT_OTHER): Payer: Self-pay | Admitting: Emergency Medicine

## 2014-05-14 ENCOUNTER — Telehealth: Payer: Self-pay | Admitting: Family Medicine

## 2014-05-14 ENCOUNTER — Emergency Department (HOSPITAL_BASED_OUTPATIENT_CLINIC_OR_DEPARTMENT_OTHER)
Admission: EM | Admit: 2014-05-14 | Discharge: 2014-05-14 | Discharge status: Against Medical Advice | Payer: Self-pay | Attending: Emergency Medicine | Admitting: Emergency Medicine

## 2014-05-14 ENCOUNTER — Emergency Department (HOSPITAL_BASED_OUTPATIENT_CLINIC_OR_DEPARTMENT_OTHER): Payer: Self-pay

## 2014-05-14 ENCOUNTER — Other Ambulatory Visit: Payer: Self-pay

## 2014-05-14 DIAGNOSIS — Z720 Tobacco use: Secondary | ICD-10-CM | POA: Insufficient documentation

## 2014-05-14 DIAGNOSIS — Z7952 Long term (current) use of systemic steroids: Secondary | ICD-10-CM | POA: Insufficient documentation

## 2014-05-14 DIAGNOSIS — J441 Chronic obstructive pulmonary disease with (acute) exacerbation: Secondary | ICD-10-CM | POA: Insufficient documentation

## 2014-05-14 DIAGNOSIS — Z792 Long term (current) use of antibiotics: Secondary | ICD-10-CM | POA: Insufficient documentation

## 2014-05-14 DIAGNOSIS — Z880 Allergy status to penicillin: Secondary | ICD-10-CM | POA: Insufficient documentation

## 2014-05-14 DIAGNOSIS — R0600 Dyspnea, unspecified: Secondary | ICD-10-CM

## 2014-05-14 DIAGNOSIS — Z87448 Personal history of other diseases of urinary system: Secondary | ICD-10-CM | POA: Insufficient documentation

## 2014-05-14 DIAGNOSIS — Z79899 Other long term (current) drug therapy: Secondary | ICD-10-CM | POA: Insufficient documentation

## 2014-05-14 DIAGNOSIS — M1990 Unspecified osteoarthritis, unspecified site: Secondary | ICD-10-CM | POA: Insufficient documentation

## 2014-05-14 HISTORY — DX: Chronic obstructive pulmonary disease, unspecified: J44.9

## 2014-05-14 LAB — BASIC METABOLIC PANEL
Anion gap: 11 (ref 5–15)
CHLORIDE: 99 mmol/L (ref 96–112)
CO2: 28 mmol/L (ref 19–32)
CREATININE: 0.77 mg/dL (ref 0.50–1.35)
Calcium: 8.4 mg/dL (ref 8.4–10.5)
GFR calc Af Amer: >90 mL/min (ref >90)
GFR calc non Af Amer: >90 mL/min (ref >90)
Glucose, Bld: 120 mg/dL — ABNORMAL HIGH (ref 70–99)
Potassium: 3.5 mmol/L (ref 3.5–5.1)
Sodium: 138 mmol/L (ref 135–145)

## 2014-05-14 LAB — I-STAT ARTERIAL BLOOD GAS, ED
ACID-BASE EXCESS: 2 mmol/L (ref 0.0–2.0)
Bicarbonate: 28.2 mEq/L — ABNORMAL HIGH (ref 20.0–24.0)
O2 SAT: 97 %
PCO2 ART: 48.5 mmHg — AB (ref 35.0–45.0)
Patient temperature: 97.8
TCO2: 30 mmol/L (ref 0–100)
pH, Arterial: 7.37 (ref 7.350–7.450)
pO2, Arterial: 91 mmHg (ref 80.0–100.0)

## 2014-05-14 LAB — CBC WITH DIFFERENTIAL/PLATELET
BASOS PCT: 0 % (ref 0–1)
Basophils Absolute: 0.1 10*3/uL (ref 0.0–0.1)
EOS ABS: 0.1 10*3/uL (ref 0.0–0.7)
EOS PCT: 1 % (ref 0–5)
HEMATOCRIT: 40.2 % (ref 39.0–52.0)
HEMOGLOBIN: 13.4 g/dL (ref 13.0–17.0)
Lymphocytes Relative: 23 % (ref 12–46)
Lymphs Abs: 2.6 10*3/uL (ref 0.7–4.0)
MCH: 32.1 pg (ref 26.0–34.0)
MCHC: 33.3 g/dL (ref 30.0–36.0)
MCV: 96.2 fL (ref 78.0–100.0)
Monocytes Absolute: 0.9 10*3/uL (ref 0.1–1.0)
Monocytes Relative: 8 % (ref 3–12)
NEUTROS PCT: 68 % (ref 43–77)
Neutro Abs: 7.7 10*3/uL (ref 1.7–7.7)
Platelets: 386 10*3/uL (ref 150–400)
RBC: 4.18 MIL/uL — ABNORMAL LOW (ref 4.22–5.81)
RDW: 13.2 % (ref 11.5–15.5)
WBC: 11.4 10*3/uL — AB (ref 4.0–10.5)

## 2014-05-14 LAB — ETHANOL: Alcohol, Ethyl (B): 236 mg/dL — ABNORMAL HIGH (ref 0–9)

## 2014-05-14 MED ORDER — PREDNISONE 50 MG PO TABS: 50.0000 mg | ORAL_TABLET | Freq: Every day | ORAL | Status: DC

## 2014-05-14 MED ORDER — ALBUTEROL (5 MG/ML) CONTINUOUS INHALATION SOLN
10.0000 mg/h | INHALATION_SOLUTION | RESPIRATORY_TRACT | Status: DC
  Administered 2014-05-14: 10 mg/h via RESPIRATORY_TRACT
  Filled 2014-05-14: qty 20

## 2014-05-14 NOTE — Telephone Encounter (Signed)
If the patient is having this much trouble and this urgently, he should go to the emergency room with them evaluate him and they can refer him to the oncology Center.

## 2014-05-14 NOTE — Discharge Instructions (Signed)

## 2014-05-14 NOTE — ED Notes (Addendum)
SOB and cough x4 week.  Worse today.  ETOH onboard. Albuterol neb PTA by EMS.

## 2014-05-14 NOTE — ED Notes (Signed)
Patient transported to X-ray 

## 2014-05-14 NOTE — Telephone Encounter (Signed)
James Mills you may need to help me with this patient and his requests.

## 2014-05-14 NOTE — ED Notes (Addendum)
Pt got OOB and pulled out his IV, got dressed and started walking down the hallway, dripping blood. Pt sts he was not doing any better with the breathing tx and he wants to "go home".  Sts "My elderly mother is at home and I am her only caregiver."  Pt assisted back to bed and MD is at bedside discussing with pt the risk of his demise if he goes home at this time. Discussing his need for Oxygen.  Pt is able to articulate the doctor's concerns back to him.  Pt is very insistent that he goes home right now. Refuses to stay.

## 2014-05-14 NOTE — ED Notes (Signed)
Cab Voucher given to pt to get home.

## 2014-05-14 NOTE — Telephone Encounter (Signed)
Patient aware that we do not have any records and patient states that he need a referral to cancer center and does not have the money to come in the office and to go to oncologist.

## 2014-05-14 NOTE — ED Provider Notes (Signed)
CSN: [id number]     Arrival date & time 05/14/14  1739 History  This chart was scribed for Mirian Mo, MD by Elon Spanner, ED Scribe. This patient was seen in room MH07/MH07 and the patient's care was started at 6:24 PM.   Chief Complaint  Patient presents with  . Shortness of Breath   The history is provided by the patient. No language interpreter was used.   HPI Comments: James Mills is a 59 y.o. male with a history of COPD brought in by ambulance, who presents to the Emergency Department complaining of chronic SOB the last 3-6 weeks with acute worsening today.  He is prescribed a nebulizer but not at-home oxygen.  Patient reports he has been drinking "very slight" today, having 2 beers at 5:30 am.  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  Patient reports he smokes 5-7 cigarettes/day, an improvement on his previous 10 pack/week.  Patient denies cough. No associated fever.  Symptoms constant.  Exacerbated by movement, deep breathing.    Past Medical History  Diagnosis Date  . Shoulder pain   . Arthritis   . BPH (benign prostatic hyperplasia)   . COPD (chronic obstructive pulmonary disease)    Past Surgical History  Procedure Laterality Date  . Back surgery     No family history on file. History  Substance Use Topics  . Smoking status: Current Every Day Smoker -- 1.00 packs/day    Types: Cigarettes    Start date: 07/19/1977  . Smokeless tobacco: Never Used  . 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 Negative for cough.   All other systems reviewed and are negative.     Allergies  Azithromycin; Darvocet; Doxycycline; and Penicillins  Home Medications   Prior to Admission medications   Medication Sig Start Date End Date Taking? Authorizing Provider  albuterol (PROVENTIL HFA;VENTOLIN HFA) 108 (90 BASE) MCG/ACT inhaler Inhale 2 puffs into  the lungs every 4 (four) hours as needed for wheezing or shortness of breath. 07/13/13   Shon Baton, MD  hydrOXYzine (ATARAX/VISTARIL) 25 MG tablet One po tid prn anxiety prn 09/28/12   Deatra Canter, FNP  ibuprofen (ADVIL,MOTRIN) 200 MG tablet Take 200 mg by mouth every 6 (six) hours as needed for pain.    Historical Provider, MD  levofloxacin (LEVAQUIN) 500 MG tablet Take 1 tablet (500 mg total) by mouth daily. 07/13/13   Shon Baton, MD  MELATONIN PO Take 1 capsule by mouth at bedtime as needed.    Historical Provider, MD  omeprazole (PRILOSEC) 20 MG capsule Take 1 capsule (20 mg total) by mouth daily. 08/04/12   Wayland Salinas, MD  predniSONE (DELTASONE) 50 MG tablet Take 1 tablet (50 mg total) by mouth daily. 05/14/14   Mirian Mo, MD  sertraline (ZOLOFT) 50 MG tablet Take 1 tablet (50 mg total) by mouth daily. 09/28/12   Deatra Canter, FNP  traMADol (ULTRAM) 50 MG tablet Take 1 tablet (50 mg total) by mouth every 8 (eight) hours as needed for pain. 09/28/12   Deatra Canter, FNP   BP 128/77 mmHg  Pulse 141  Temp(Src) 98.8 F (37.1 C) (Oral)  Resp 20  Ht  (1.753 m)  Wt 155 lb (70.308 kg)  BMI 22.88 kg/m2  SpO2 90% Physical Exam  Constitutional: He  is oriented to person, place, and time. He appears well-developed and well-nourished.  HENT:  Head: Normocephalic and atraumatic.  Eyes: Conjunctivae and EOM are normal.  Neck: Normal range of motion. Neck supple.  Cardiovascular: Normal rate, regular rhythm and normal heart sounds.   Pulmonary/Chest: Effort normal. Tachypnea noted. No respiratory distress. He has wheezes (bilaterally).  Abdominal: He exhibits no distension. There is no tenderness. There is no rebound and no guarding.  Musculoskeletal: Normal range of motion.  Neurological: He is alert and oriented to person, place, and time.  Skin: Skin is warm and dry.  Vitals reviewed.   ED Course  Procedures (including critical care time)  DIAGNOSTIC  STUDIES: Oxygen Saturation is 97% on RA, normal by my interpretation.    COORDINATION OF CARE:  6:29 PM Discussed treatment plan with patient at bedside.  Patient acknowledges and agrees with plan.    Labs Review Labs Reviewed  CBC WITH DIFFERENTIAL/PLATELET - Abnormal; Notable for the following:    WBC 11.4 (*)    RBC 4.18 (*)    All other components within normal limits  BASIC METABOLIC PANEL - Abnormal; Notable for the following:    Glucose, Bld 120 (*)    BUN <5 (*)    All other components within normal limits  ETHANOL - Abnormal; Notable for the following:    Alcohol, Ethyl (B) 236 (*)    All other components within normal limits  I-STAT ARTERIAL BLOOD GAS, ED - Abnormal; Notable for the following:    pCO2 arterial 48.5 (*)    Bicarbonate 28.2 (*)    All other components within normal limits    Imaging Review Dg Chest 2 View  05/14/2014   CLINICAL DATA:  Cough hand shortness of breath for 3 weeks. COPD. Tobacco use.  EXAM: CHEST  2 VIEW  COMPARISON:  Multiple exams, including 08/16/2013  FINDINGS: Reverse lordotic projection. Mild interstitial accentuation, as can commonly be encountered in smokers. Bilateral airway thickening. Low lung volumes. No airspace opacity identified. No pleural effusion observed.  Multiple mid thoracic adjacent compression fractures as on prior CT scan.  IMPRESSION: 1. Airway thickening is present, suggesting bronchitis or reactive airways disease. 2. Mild interstitial accentuation, as can commonly be encountered in smokers. 3. Low-dose CT lung cancer screening is recommended for patients who are 65-2 years of age with a 30+ pack-year history of smoking, and who are currently smoking or quit <=15 years ago. 4. Multiple stable appearing mid thoracic compression fractures as on prior CT scan.   Electronically Signed   By: Gaylyn Rong M.D.   On: 05/14/2014 18:23     EKG Interpretation   Date/Time:  Tuesday May 14 2014 18:16:10 EDT Ventricular  Rate:  112 PR Interval:  154 QRS Duration: 90 QT Interval:  336 QTC Calculation: 458 R Axis:   29 Text Interpretation:  Sinus tachycardia Otherwise normal ECG No  significant change since last tracing Confirmed by Mirian Mo [phone])  on 05/14/2014 6:21:07 PM      MDM   Final diagnoses:  Dyspnea    59 y.o. male with pertinent PMH of COPD presents with recurrent cough, dyspnea. Patient has been seen numerous times in the past for identical symptoms. In the past he is left AGAINST MEDICAL ADVICE after being told that he should be admitted for hypoxia. The patient presents hypoxic today, requiring 3-4 L of oxygen to maintain saturations in the 90%. Vital signs and physical exam as above. Concern for COPD exacerbation, patient placed on  continuous albuterol.  Workup as above. Patient started on induction. Unfortunately, the patient requested to leave against medical device. I discussed the risks of him leaving at length including, death, permanent disability. I specifically addressed that he needed oxygen at this time which he would not receive home. Although his alcohol is elevated, the patient was able to reiterate the risks of returning home, appears competent to make medical decisions.  Discharged home against medical advice. I provided a steroid prescription for the patient in hopes that this was the best of a bad situation and potentially could improve his symptoms. I encouraged the patient to seek follow-up as soon as possible. I also endorses that steroids were not curative and that I did not endorse the patient's decision to return home.  I have reviewed all laboratory and imaging studies if ordered as above  1. Dyspnea           Mirian MoMatthew Gentry, MD 05/14/14 [phone]

## 2014-05-15 NOTE — Telephone Encounter (Signed)
I called patient to tell him that we would not be able to make any referrals until he had been seen by one of our providers.Pt proceeds to tell me that he was seen in the ER on 05/14/14. Per patient, all exams were done and referrals made to the doctors he needed them to be made to. He states that he was told he has esophageal cancer or lung cancer or both.He has no appointments, but is expecting the ER to call him today with that information.

## 2014-08-14 ENCOUNTER — Telehealth: Payer: Self-pay | Admitting: *Deleted

## 2014-08-14 NOTE — Telephone Encounter (Signed)
Who is this?

## 2014-08-14 NOTE — Telephone Encounter (Signed)
Pt called and left voice message wanting to talk to [name], NP for Dr. Truett Perna.  Noted that pt has not been seen by our office at the present.   Called pt back and left message on voice mail requesting a call back for further information. Pt's  Phone  [phone].

## 2014-08-16 ENCOUNTER — Telehealth: Payer: Self-pay | Admitting: Nurse Practitioner

## 2014-08-16 NOTE — Telephone Encounter (Signed)
I returned Mr. Albach call. He wanted to let us know how much he appreciated the care his mother had received through our office.

## 2014-08-16 NOTE — Telephone Encounter (Signed)
James Mills was calling to let us know that his health problems have been addressed and he is following up at [facility]. He also wanted to let us know that his mother is not doing well with regard to her heart and wanted to thank [place] for the care she had received through our office.

## 2014-08-25 ENCOUNTER — Emergency Department (HOSPITAL_COMMUNITY)
Admission: EM | Admit: 2014-08-25 | Discharge: 2014-08-25 | Discharge status: Against Medical Advice | Payer: Self-pay | Attending: Emergency Medicine | Admitting: Emergency Medicine

## 2014-08-25 ENCOUNTER — Encounter (HOSPITAL_COMMUNITY): Payer: Self-pay | Admitting: *Deleted

## 2014-08-25 ENCOUNTER — Emergency Department (HOSPITAL_COMMUNITY): Payer: Self-pay

## 2014-08-25 DIAGNOSIS — R1084 Generalized abdominal pain: Secondary | ICD-10-CM | POA: Insufficient documentation

## 2014-08-25 DIAGNOSIS — Z79899 Other long term (current) drug therapy: Secondary | ICD-10-CM | POA: Insufficient documentation

## 2014-08-25 DIAGNOSIS — M1990 Unspecified osteoarthritis, unspecified site: Secondary | ICD-10-CM | POA: Insufficient documentation

## 2014-08-25 DIAGNOSIS — Z87438 Personal history of other diseases of male genital organs: Secondary | ICD-10-CM | POA: Insufficient documentation

## 2014-08-25 DIAGNOSIS — Z720 Tobacco use: Secondary | ICD-10-CM | POA: Insufficient documentation

## 2014-08-25 DIAGNOSIS — J441 Chronic obstructive pulmonary disease with (acute) exacerbation: Secondary | ICD-10-CM | POA: Insufficient documentation

## 2014-08-25 DIAGNOSIS — F10120 Alcohol abuse with intoxication, uncomplicated: Secondary | ICD-10-CM | POA: Insufficient documentation

## 2014-08-25 DIAGNOSIS — F10920 Alcohol use, unspecified with intoxication, uncomplicated: Secondary | ICD-10-CM

## 2014-08-25 MED ORDER — PREDNISONE 50 MG PO TABS: 50.0000 mg | ORAL_TABLET | Freq: Every day | ORAL | Status: AC

## 2014-08-25 MED ORDER — IPRATROPIUM-ALBUTEROL 0.5-2.5 (3) MG/3ML IN SOLN: 3.0000 mL | Freq: Once | RESPIRATORY_TRACT | Status: DC

## 2014-08-25 NOTE — Discharge Instructions (Signed)
Read the information below.  Use the prescribed medication as directed.  Please discuss all new medications with your pharmacist.  You may return to the Emergency Department at any time for worsening condition or any new symptoms that concern you.   If you develop worsening shortness of breath, uncontrolled wheezing, severe chest pain, or fevers despite using tylenol and/or ibuprofen, return for a recheck.       Chronic Obstructive Pulmonary Disease Exacerbation  Chronic obstructive pulmonary disease (COPD) is a common lung problem. In COPD, the flow of air from the lungs is limited. COPD exacerbations are times that breathing gets worse and you need extra treatment. Without treatment they can be life threatening. If they happen often, your lungs can become more damaged. HOME CARE  Do not smoke.  Avoid tobacco smoke and other things that bother your lungs.  If given, take your antibiotic medicine as told. Finish the medicine even if you start to feel better.  Only take medicines as told by your doctor.  Drink enough fluids to keep your pee (urine) clear or pale yellow (unless your doctor has told you not to).  Use a cool mist machine (vaporizer).  If you use oxygen or a machine that turns liquid medicine into a mist (nebulizer), continue to use them as told.  Keep up with shots (vaccinations) as told by your doctor.  Exercise regularly.  Eat healthy foods.  Keep all doctor visits as told. GET HELP RIGHT AWAY IF:  You are very short of breath and it gets worse.  You have trouble talking.  You have bad chest pain.  You have blood in your spit (sputum).  You have a fever.  You keep throwing up (vomiting).  You feel weak, or you pass out (faint).  You feel confused.  You keep getting worse. MAKE SURE YOU:   Understand these instructions.  Will watch your condition.  Will get help right away if you are not doing well or get worse. Document Released: 01/28/2011  Document Revised: 11/29/2012 Document Reviewed: 10/13/2012 [facility] Patient Information 2015 [place], [place]. This information is not intended to replace advice given to you by your health care provider. Make sure you discuss any questions you have with your health care provider.  Alcohol Intoxication Alcohol intoxication occurs when you drink enough alcohol that it affects your ability to function. It can be mild or very severe. Drinking a lot of alcohol in a short time is called binge drinking. This can be very harmful. Drinking alcohol can also be more dangerous if you are taking medicines or other drugs. Some of the effects caused by alcohol may include:  Loss of coordination.  Changes in mood and behavior.  Unclear thinking.  Trouble talking (slurred speech).  Throwing up (vomiting).  Confusion.  Slowed breathing.  Twitching and shaking (seizures).  Loss of consciousness. HOME CARE  Do not drive after drinking alcohol.  Drink enough water and fluids to keep your pee (urine) clear or pale yellow. Avoid caffeine.  Only take medicine as told by your doctor. GET HELP IF:  You throw up (vomit) many times.  You do not feel better after a few days.  You frequently have alcohol intoxication. Your doctor can help decide if you should see a substance use treatment counselor. GET HELP RIGHT AWAY IF:  You become shaky when you stop drinking.  You have twitching and shaking.  You throw up blood. It may look bright red or like coffee grounds.  You notice  blood in your poop (bowel movements).  You become lightheaded or pass out (faint). MAKE SURE YOU:   Understand these instructions.  Will watch your condition.  Will get help right away if you are not doing well or get worse. Document Released: 07/28/2007 Document Revised: 10/11/2012 Document Reviewed: 07/14/2012 [facility] Patient Information 2015 [place], [place]. This information is not intended to replace advice given  to you by your health care provider. Make sure you discuss any questions you have with your health care provider.    Emergency Department Resource Guide 1) Find a Doctor and Pay Out of Pocket Although you won't have to find out who is covered by your insurance plan, it is a good idea to ask around and get recommendations. You will then need to call the office and see if the doctor you have chosen will accept you as a new patient and what types of options they offer for patients who are self-pay. Some doctors offer discounts or will set up payment plans for their patients who do not have insurance, but you will need to ask so you aren't surprised when you get to your appointment.  2) Contact Your Local Health Department Not all health departments have doctors that can see patients for sick visits, but many do, so it is worth a call to see if yours does. If you don't know where your local health department is, you can check in your phone book. The CDC also has a tool to help you locate your state's health department, and many state websites also have listings of all of their local health departments.  3) Find a Walk-in Clinic If your illness is not likely to be very severe or complicated, you may want to try a walk in clinic. These are popping up all over the country in pharmacies, drugstores, and shopping centers. They're usually staffed by nurse practitioners or physician assistants that have been trained to treat common illnesses and complaints. They're usually fairly quick and inexpensive. However, if you have serious medical issues or chronic medical problems, these are probably not your best option.  No Primary Care Doctor: - Call Health Connect at  [phone] - they can help you locate a primary care doctor that  accepts your insurance, provides certain services, etc. - Physician Referral Service- [phone]  Chronic Pain Problems: Organization         Address  Phone   Notes  [facility]  [phone] Patients need to be referred by their primary care doctor.   Medication Assistance: Organization         Address  Phone   Notes  [facility] Medication [facility] [address] [place] [place], [place] [address] [phone] --Must be a resident of [facility] -- Must have NO insurance coverage whatsoever (no Medicaid/ Medicare, etc.) -- The pt. MUST have a primary care doctor that directs their care regularly and follows them in the community   Med[facility]ist  [phone]   [employer]  [phone]    Agencies that provide inexpensive medical care: Organization         Address  Phone   Notes  [facility]  [phone]   [facility]    [phone]   [facility] [address] [place], [place] [address] [phone]   [facility] [address] [place] [place] [phone]   Planned Parenthood    (  [phone]   [facility]    [phone]   [facility]  [address], [place] Phone:  [phone], Fax:  [phone] Hours of Operation:  9 am - 6 pm, M-F.  Also accepts Medicaid/Medicare and self-pay.  [facility] for Children  [address], [place] Phone: [phone], Fax: [phone]. Hours of Operation:  8:30 am - 5:30 pm, M-F.  Also accepts Medicaid and self-pay.  [facility] [place] [address] [place] Phone: [phone]   [facility] [address] Natasha Bence [place], [place] [phone], Ext. [phone] Mondays & Thursdays: 7-9 AM.  First 15 patients are seen on a first come, first serve basis.    Medicaid-accepting [facility] Providers:  Organization         Address  Phone   Notes  [facility] [address], [place] [phone] Also accepts self-pay patients.  [facility] [address]  Laurell Josephs [place], [place]  [phone]   [facility] [address], [place] [phone]   [facility] [address], [place] [phone]   Renaye Rakers [address], [place]   [phone] Only accepts [place] Access [place] patients after they have their name applied to their card.   Self-Pay (no insurance) in [facility]:  Organization         Address  Phone   Notes  Sickle Cell Patients, [facility] Internal Medicine [address] [place], [place] [phone]   [facility] [address] [place], [place] [phone]   [facility] [place]  [address], [place] [phone]   [facility]/Dr. Osei-Bonsu  [address] [place] or [address], [place] [phone] Phone number for both [place] and [place] locations is the same.  [facility]gent [facility] and [facility] [address], [place] [phone]   [facility] [address], [place] or [address] [phone]   [facility] [address] [place] [phone], phone; [phone], fax Sees patients 1st and 3rd Saturday of every month.  Must not qualify for public or private insurance (i.e. Medicaid, Medicare, [place] Health Choice, Veterans' Benefits)  Household income should be no more than 200% of the poverty level The clinic cannot treat you if you are pregnant or think you are pregnant  Sexually transmitted diseases are not treated at the clinic.    Dental Care: Organization         Address  Phone  Notes  [facility] [address] [place], [place] [phone] Accepts children up to age 22 who are enrolled in [place] or [place] Health Choice; pregnant women with a Medicaid card; and children who have applied for Medicaid  or [place] Health Choice, but were declined, whose parents can pay a reduced fee at time of service.  [facility] Department of [facility]  [address], [place] [phone] Accepts children up to age 40 who are enrolled in [place] or [place] Health Choice; pregnant women with a Medicaid card; and children who have applied for Medicaid or [place] Health Choice, but were declined, whose parents can pay a  reduced fee at time of service.  [facility]  [address], [place] [phone] Patients are seen by appointment only. Walk-ins are not accepted. [facility] will see patients 57 years of age and older. Monday - Tuesday (8am-5pm) Most Wednesdays (8:30-5pm) $30 per visit, cash only  [facility]  [address], [facility] [phone] Patients are seen by appointment only. Walk-ins are not accepted. [facility] will see patients 57 years of age and older. One Wednesday Evening (Monthly: Volunteer Based).  $30 per visit, cash only  [employer] [facility]  [phone] for adults; Children under age 374, call Graduate Pediatric Dentistry at [phone]. Children aged 684-14, please call [phone] to request a pediatric application.  Dental services are provided in all areas of dental care including fillings, crowns and bridges, complete and partial dentures, implants, gum treatment, root canals, and extractions. Preventive care is also provided. Treatment is provided to both adults and children. Patients are selected via a lottery and there is often a waiting list.   [facility] [address], [place]  [phone] [email or web address]   [facility] [address], [place], [place] [phone], Ext. [phone] Second and Fourth Thursday of each month, opens at 6:30 AM; Clinic ends at 9 AM.  Patients are seen on a first-come first-served basis, and a limited number are seen  during each clinic.   [facility]  [address], [place], [place] [phone]   Eligibility Requirements You must have lived in [place], [place], or [place] counties for at least the last three months.   You cannot be eligible for state or federal sponsored National Cityhealthcare insurance, including CIGNAVeterans Administration, [place]icaid, or [employer]icare.   You generally cannot be eligible for healthcare insurance through your employer.    How to apply: Eligibility screenings are held every Tuesday and Wednesday afternoon from 1:00 pm until 4:00 pm. You do not need an appointment for the interview!  [facility] [address], [place], [place] [phone]   [facility]  [phone]   [facility]  [phone]   [facility]  [phone]    Behavioral Health Resources in the Community: Intensive Outpatient Programs Organization         Address  Phone  Notes  [facility] [address], [place], [place] [phone]   [facility] Outpatient [address], [place], [place] [phone]   [facility] [address], [place], [place]  [phone]   [facility] [address],  [place], [place] [phone] or [phone]   Substance Abuse Resources Organization         Address  Phone  Notes  [facility] Drug Services  [phone]   [facility]  [phone]   [facility]  [phone]   [facility]  [phone]   Residential & Outpatient Substance Abuse Program  [phone]   Psychological Services Organization         Address  Phone  Notes  [facility]  [phone]- [phone]   [facility]  [phone]- [phone]   [facility] [address], [place] [phone] or [phone]    Mobile Crisis Teams Organization         Address  Phone  Notes  [facility],  Mobile Crisis Care Unit  [phone]  [facility]  [address] [place], [place] [phone]   [facility] [address] [place] [phone]    Self-Help/Support Groups Organization         Address  Phone             Notes  [facility] [place] - variety of support groups  336- I7437963 Call for more information  Narcotics Anonymous (NA), [facility] [address], [employer] [place]  2 meetings at this location   Statistician         Address  Phone  Notes  [facility] [address],    [place]  [phone]   [facility]  [address], [place] [address], [place], [place] [phone]   [facility] [address] [place], [place] [phone] Admissions: 8am-3pm M-F  [facility] [address],    [place], [place] [phone]   [facility] [address] [place], [place], [place] [phone]   [facility] [address],  [place], [place] [phone]   [facility] - Intensive Outpatient [address] Laurell Josephs [address], [place], [place] [phone]   [facility] ([facility]iction [facility] [address] [place].,  [place], [place] [phone] or [phone]   [facility] ([facility]) [address] [place], [place] [phone] Accepts Medicaid  [facility] [address]  [place] [phone] Substance Abuse/Addiction Treatment   [facility] Organization         Address  Phone  Notes  [facility]  [phone]   Angie Fava, PhD [address] Ervin Knack [place], [place]   [phone] or [phone]   [facility]   [address] [place], [place] [phone]   [facility] [address] [place], [place] [phone] Insurance/Medicaid/sponsorship through [facility] and Families [address]                                    [place], [place] [phone] Therapy/tele-psych/case  [facility] [address], [place] [phone]    Dr. Lolly Mustache  [phone]   [facility]  [employer] [facility] 1) [address], [place] 2) [address], [place] 3)  [address], [place] [phone]  [phone]   [facility] Child Abuse Hotline 6022937907 or 9142702065 (After Hours)

## 2014-08-25 NOTE — ED Notes (Signed)
Pt leaving AMA, cancel blood draw

## 2014-08-25 NOTE — ED Notes (Signed)
Bed: WA10 Expected date: 08/25/14 Expected time: 12:00 PM Means of arrival: Ambulance [facility]

## 2014-08-25 NOTE — ED Notes (Signed)
Patient is leaving AMA.

## 2014-08-25 NOTE — ED Notes (Signed)
Per EMS pt coming from home with c/o shortness of breath. Pt was given  albuterol and 0.5 atrovent en route. O2 99% on room air. Pt is intoxicated and is having hard time staying awake long enough to answer any questions at this time. Per EMS ot has hx of chronic alcohol abuse as well as lung cancer.

## 2014-08-25 NOTE — ED Notes (Signed)
Pt reports he [facility] and was told he is "in last stage of pulmonary disease and will die from it". He denies lung cancer and sts was supposed "to be tested for it but they don't think that's it". He reports he is still smoking due to stress because of his mom severe illness. Pt O2 sats are 90% on room air, however he refuses to lay on his back and to sit up in bed. He reports chest pain but sts "Dr Maisie Fushomas told me that my heart is fine, that's just chest wall pain from all the coughing. My next step will be full time oxygen". Pt placed on cardiac monitor which shows tachycardia.

## 2014-08-25 NOTE — ED Notes (Signed)
Pt walked to nurses station stating he is leaving now, walked back to his room pulled out his IV. Bleeding controled at this time. When asked why he wants to leave he sts "I owe damn hospital twenty thousand dollars, and [facility] doesn't care about me just my damn money". Attempted to explain to pt that we are obligated to provide treatment regardless of his ability to pay, and that it would be a poor decision for him to leave AMA since his oxygen level is low. Pt response was "It's not your fault, you were nice, but I don't have insurance and I don't have money to stay. I called 911 'cause i felt bad, now I'm feeling better". Irving BurtonEmily PA notified. Pt will leave AMA

## 2014-08-25 NOTE — ED Provider Notes (Signed)
CSN: [id number]     Arrival date & time 08/25/14  1217 History   First MD Initiated Contact with Patient 08/25/14 1251     Chief Complaint  Patient presents with  . Shortness of Breath  . Alcohol Intoxication     (Consider location/radiation/quality/duration/timing/severity/associated sxs/prior Treatment) The history is provided by the patient.    Pt with hx COPD, alcohol and tobacco abuse p/w episode of SOB that began this morning.  States he had an episode of SOB and difficulty breathing and coughing with posttussive emesis.  His cough is productive of either clear or white frothy sputum.  Emesis is nonbloody.  Has had chills.  Denies known fever, abdominal pain, change in bowel or bladder function, leg swelling.    Past Medical History  Diagnosis Date  . Shoulder pain   . Arthritis   . BPH (benign prostatic hyperplasia)   . COPD (chronic obstructive pulmonary disease)    Past Surgical History  Procedure Laterality Date  . Back surgery     No family history on file. History  Substance Use Topics  . Smoking status: Current Every Day Smoker -- 1.00 packs/day    Types: Cigarettes    Start date: 07/19/1977  . Smokeless tobacco: Never Used  . Alcohol Use: 14.4 oz/week    24 Cans of beer per week     Comment: binge drinker up to 24 beers per day    Review of Systems  All other systems reviewed and are negative.     Allergies  Azithromycin; Benadryl; Darvocet; Doxycycline; and Penicillins  Home Medications   Prior to Admission medications   Medication Sig Start Date End Date Taking? Authorizing Provider  albuterol (PROVENTIL HFA;VENTOLIN HFA) 108 (90 BASE) MCG/ACT inhaler Inhale 2 puffs into the lungs every 4 (four) hours as needed for wheezing or shortness of breath. 07/13/13  Yes Shon Batonourtney F Horton, MD  ALPRAZolam Prudy Feeler(XANAX) 0.5 MG tablet Take 0.5 mg by mouth 2 (two) times daily.   Yes Historical Provider, MD  ibuprofen (ADVIL,MOTRIN) 200 MG tablet Take 400 mg by mouth  every 6 (six) hours as needed for headache or moderate pain.    Yes Historical Provider, MD  hydrOXYzine (ATARAX/VISTARIL) 25 MG tablet One po tid prn anxiety prn Patient not taking: Reported on 08/25/2014 09/28/12   Deatra CanterWilliam J Oxford, FNP  levofloxacin (LEVAQUIN) 500 MG tablet Take 1 tablet (500 mg total) by mouth daily. Patient not taking: Reported on 08/25/2014 07/13/13   Shon Batonourtney F Horton, MD  omeprazole (PRILOSEC) 20 MG capsule Take 1 capsule (20 mg total) by mouth daily. Patient not taking: Reported on 08/25/2014 08/04/12   Wayland SalinasJohn Bednar, MD  predniSONE (DELTASONE) 50 MG tablet Take 1 tablet (50 mg total) by mouth daily. Patient not taking: Reported on 08/25/2014 05/14/14   Mirian MoMatthew Gentry, MD  sertraline (ZOLOFT) 50 MG tablet Take 1 tablet (50 mg total) by mouth daily. Patient not taking: Reported on 08/25/2014 09/28/12   Deatra CanterWilliam J Oxford, FNP  traMADol (ULTRAM) 50 MG tablet Take 1 tablet (50 mg total) by mouth every 8 (eight) hours as needed for pain. Patient not taking: Reported on 08/25/2014 09/28/12   Anselm PancoastWilliam J Oxford, FNP   BP 109/69 mmHg  Pulse 107  Resp 21  SpO2 87% Physical Exam  Constitutional: He appears well-developed and well-nourished. No distress.  HENT:  Head: Normocephalic and atraumatic.  Neck: Neck supple.  Cardiovascular: Normal rate and regular rhythm.   Pulmonary/Chest: Effort normal. No respiratory distress. He has decreased breath  sounds. He has no wheezes. He has rhonchi. He has no rales.  Abdominal: Soft. He exhibits no distension. There is generalized tenderness. There is no rebound and no guarding.  Neurological: He is alert. He exhibits normal muscle tone.  Skin: He is not diaphoretic.  Nursing note and vitals reviewed.   ED Course  Procedures (including critical care time) Labs Review Labs Reviewed - No data to display  Imaging Review No results found.   EKG Interpretation None      MDM   Final diagnoses:  COPD exacerbation  Alcohol intoxication,  uncomplicated    Afebrile, nontoxic patient with COPD, alcoholism p/w SOB, cough.  After I saw the patient, he suddenly decided to leave the hospital.  States he owes a lot of money to [facility] and he cannot increase the amount he owes.  He states his neighbor called EMS this morning but he does not want any tests or treatment.  He is able to verbalize to me that he understands that he has "low oxygen and high heart rate" unprompted.  He verbalizes understanding that this can kill him.  Despite this, he is very concerned about the amount he owes and he refuses to stay in the ED.  He is speaking clearly and walking without any gait abnormalities, he is clearly discussing his medical problems and desire to go and making a plan for leaving - has requested that we call him a cab and states he has the money, he even knows which cab company he prefers to use.  Pt signed out AMA.  I have given him information and prescription for steroids, have encouraged him to follow closely with his doctor or return at any time if he changes his mind.  Pt verbalizes understanding.     Trixie Dredge, PA-C 08/25/14 1452  Benjiman Core, MD 08/25/14 1556
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
## 2016-03-25 DEATH — deceased

## 2021-03-25 DEATH — deceased
